# Patient Record
Sex: Female | Born: 1990 | Race: White | Hispanic: No | Marital: Married | Smoking: Never smoker
Health system: Southern US, Community
[De-identification: ages and names within clinical notes are randomized; demographics above are authoritative.]

## PROBLEM LIST (undated history)

## (undated) DIAGNOSIS — B009 Herpesviral infection, unspecified: Secondary | ICD-10-CM

## (undated) DIAGNOSIS — L709 Acne, unspecified: Secondary | ICD-10-CM

## (undated) HISTORY — DX: Herpesviral infection, unspecified: B00.9

## (undated) HISTORY — PX: NO PAST SURGERIES: SHX2092

## (undated) HISTORY — DX: Acne, unspecified: L70.9

---

## 2015-02-19 ENCOUNTER — Encounter: Payer: Self-pay | Admitting: Emergency Medicine

## 2015-02-19 ENCOUNTER — Ambulatory Visit
Admission: EM | Admit: 2015-02-19 | Discharge: 2015-02-19 | Disposition: A | Discharge status: Home / Self Care | Payer: 59 | Attending: Family Medicine | Admitting: Family Medicine

## 2015-02-19 ENCOUNTER — Ambulatory Visit: Payer: 59

## 2015-02-19 DIAGNOSIS — Y9351 Activity, roller skating (inline) and skateboarding: Secondary | ICD-10-CM | POA: Diagnosis not present

## 2015-02-19 DIAGNOSIS — W19XXXA Unspecified fall, initial encounter: Secondary | ICD-10-CM | POA: Insufficient documentation

## 2015-02-19 DIAGNOSIS — S40011A Contusion of right shoulder, initial encounter: Secondary | ICD-10-CM | POA: Diagnosis not present

## 2015-02-19 DIAGNOSIS — M25511 Pain in right shoulder: Secondary | ICD-10-CM | POA: Diagnosis present

## 2015-02-19 MED ORDER — IBUPROFEN 800 MG PO TABS: 800.0000 mg | ORAL_TABLET | Freq: Once | ORAL | Status: DC

## 2015-02-19 MED ORDER — MUPIROCIN CALCIUM 2 % EX CREA: 1.0000 "application " | TOPICAL_CREAM | Freq: Three times a day (TID) | CUTANEOUS | Status: DC

## 2015-02-19 NOTE — ED Provider Notes (Signed)
CSN: [id number]     Arrival date & time 02/19/15  1418 History   First MD Initiated Contact with Patient 02/19/15 1537     Chief Complaint  Patient presents with  . Abrasion  . Shoulder Pain    Right   (Consider location/radiation/quality/duration/timing/severity/associated sxs/prior Treatment) HPI23 yo F skate boarding last night - downhill-picked up speed and hit a sidewalk seam-  Right shoulder forward, hit first and  abraded; also right knee and right elbow.NO head contact. Witnessed. Has used arm since accident -soulder stiff and aching or awakening this AM. Last pain med was ibuprofen 800 mg po last night History reviewed. No pertinent past medical history. History reviewed. No pertinent past surgical history. History reviewed. No pertinent family history. History  Substance Use Topics  . Smoking status: Never Smoker   . Smokeless tobacco: Never Used  . Alcohol Use: Yes   OB History    No data available     Review of Systems Review of 10 systems negative for acute change except as referenced in HPI  Allergies  Review of patient's allergies indicates no known allergies.  Home Medications   Prior to Admission medications   Medication Sig Start Date End Date Taking? Authorizing Provider  mupirocin cream (BACTROBAN) 2 % Apply 1 application topically 3 (three) times daily. Please substitute with ointment 02/19/15   Rae Halsted, PA-C   BP 119/77 mmHg  Pulse 54  Temp(Src) 98.1 F (36.7 C) (Tympanic)  Resp 16  Ht 5' 2.5" (1.588 m)  Wt 123 lb (55.792 kg)  BMI 22.12 kg/m2  SpO2 97%  LMP 02/12/2015 (Exact Date) Physical Exam Constitutional -alert and oriented,well appearing and in mild distress Head-atraumatic Eyes- conjunctiva normal, EOMI ,conjugate gaze Ears -TM and canals neg Nose- no congestion or rhinorrhea Mouth/throat- mucous membranes moist ,no dental damage Neck- supple  CV- regular rate,brady - athlete- grossly normal heart sounds,  Resp-no distress, normal  respiratory effort,clear to auscultation bilaterally GI- soft,non-tender,no distention GU- not examined MSK- right knee with 2 abrasions, ankles without trauma, FROM; Right shoulder with posterior superficial abrasion ; ROM passive without restriction- active has anterior shoulder pain with int rotation, abduction and flexion. External rotation with assistance, painful active; Neuro- normal speech and language, no gross focal neurological deficit appreciated, no gait instability Skin-warm,dry  Psych-mood and affect grossly normal; speech and behavior grossly normal ED Course  Procedures (including critical care time) Labs Review Labs Reviewed - No data to display  Imaging Review Dg Shoulder Right  02/19/2015   CLINICAL DATA:  Skateboarding accident last night.  EXAM: RIGHT SHOULDER - 2+ VIEW  COMPARISON:  None.  FINDINGS: There is no evidence of fracture or dislocation. There is no evidence of arthropathy or other focal bone abnormality. Soft tissues are unremarkable.  IMPRESSION: Negative.   Electronically Signed   By: Marlan Palau M.D.   On: 02/19/2015 15:57   Medications  ibuprofen (ADVIL,MOTRIN) tablet 800 mg (not administered)  Administered by me , observed.   Wounds were cleansed and dressed with gauze wraps.  Shoulder supported by sling use prn.  ROM exercises reviewed- advance as tolerated. Ice paks/frozen peas discussed.    MDM   1. Contusion of right shoulder, initial encounter     Plan: Mother ,sister , patient in room for discharge 1. Test/x-ray results and diagnosis reviewed with patient 2. rx as per orders; risks, benefits, potential side effects reviewed with patient 3. Recommend supportive treatment with ice packs /sling to rest only 4. F/u  prn if symptoms worsen or don't improve- see Ortho in 2 weeks if without significiant improment  Rae Halsted, PA-C 02/19/15 1645

## 2015-02-19 NOTE — Discharge Instructions (Signed)
Sling for comfort- for rest- not continuous Ibuprofen 800 mg /tylenol  500 mg x 2 alternating Ice paks/frozen peas    Acromioclavicular Injuries The AC (acromioclavicular) joint is the joint in the shoulder where the collarbone (clavicle) meets the shoulder blade (scapula). The part of the shoulder blade connected to the collarbone is called the acromion. Common problems with and treatments for the [facility] joint are detailed below. ARTHRITIS Arthritis occurs when the joint has been injured and the smooth padding between the joints (cartilage) is lost. This is the wear and tear seen in most joints of the body if they have been overused. This causes the joint to produce pain and swelling which is worse with activity.  AC JOINT SEPARATION AC joint separation means that the ligaments connecting the acromion of the shoulder blade and collarbone have been damaged, and the two bones no longer line up. AC separations can be anywhere from mild to severe, and are "graded" depending upon which ligaments are torn and how badly they are torn.  Grade I Injury: the least damage is done, and the [facility] joint still lines up.  Grade II Injury: damage to the ligaments which reinforce the [facility] joint. In a Grade II injury, these ligaments are stretched but not entirely torn. When stressed, the [facility] joint becomes painful and unstable.  Grade III Injury: AC and secondary ligaments are completely torn, and the collarbone is no longer attached to the shoulder blade. This results in deformity; a prominence of the end of the clavicle. AC JOINT FRACTURE AC joint fracture means that there has been a break in the bones of the [facility] joint, usually the end of the clavicle. TREATMENT TREATMENT OF AC ARTHRITIS  There is currently no way to replace the cartilage damaged by arthritis. The best way to improve the condition is to decrease the activities which aggravate the problem. Application of ice to the joint helps decrease pain and  soreness (inflammation). The use of non-steroidal anti-inflammatory medication is helpful.  If less conservative measures do not work, then cortisone shots (injections) may be used. These are anti-inflammatories; they decrease the soreness in the joint and swelling.  If non-surgical measures fail, surgery may be recommended. The procedure is generally removal of a portion of the end of the clavicle. This is the part of the collarbone closest to your acromion which is stabilized with ligaments to the acromion of the shoulder blade. This surgery may be performed using a tube-like instrument with a light (arthroscope) for looking into a joint. It may also be performed as an open surgery through a small incision by the surgeon. Most patients will have good range of motion within 6 weeks and may return to all activity including sports by 8-12 weeks, barring complications. TREATMENT OF AN AC SEPARATION  The initial treatment is to decrease pain. This is best accomplished by immobilizing the arm in a sling and placing an ice pack to the shoulder for 20 to 30 minutes every 2 hours as needed. As the pain starts to subside, it is important to begin moving the fingers, wrist, elbow and eventually the shoulder in order to prevent a stiff or "frozen" shoulder. Instruction on when and how much to move the shoulder will be provided by your caregiver. The length of time needed to regain full motion and function depends on the amount or grade of the injury. Recovery from a Grade I AC separation usually takes 10 to 14 days, whereas a Grade III may  take 6 to 8 weeks.  Grade I and II separations usually do not require surgery. Even Grade III injuries usually allow return to full activity with few restrictions. Treatment is also based on the activity demands of the injured shoulder. For example, a high level quarterback with an injured throwing arm will receive more aggressive treatment than someone with a desk job who rarely  uses his/her arm for strenuous activities. In some cases, a painful lump may persist which could require a later surgery. Surgery can be very successful, but the benefits must be weighed against the potential risks. TREATMENT OF AN AC JOINT FRACTURE Fracture treatment depends on the type of fracture. Sometimes a splint or sling may be all that is required. Other times surgery may be required for repair. This is more frequently the case when the ligaments supporting the clavicle are completely torn. Your caregiver will help you with these decisions and together you can decide what will be the best treatment. HOME CARE INSTRUCTIONS   Apply ice to the injury for 15-20 minutes each hour while awake for 2 days. Put the ice in a plastic bag and place a towel between the bag of ice and skin.  If a sling has been applied, wear it constantly for as long as directed by your caregiver, even at night. The sling or splint can be removed for bathing or showering or as directed. Be sure to keep the shoulder in the same place as when the sling is on. Do not lift the arm.  If a figure-of-eight splint has been applied it should be tightened gently by another person every day. Tighten it enough to keep the shoulders held back. Allow enough room to place the index finger between the body and strap. Loosen the splint immediately if there is numbness or tingling in the hands.  Take over-the-counter or prescription medicines for pain, discomfort or fever as directed by your caregiver.  If you or your child has received a follow up appointment, it is very important to keep that appointment in order to avoid long term complications, chronic pain or disability. SEEK MEDICAL CARE IF:   The pain is not relieved with medications.  There is increased swelling or discoloration that continues to get worse rather than better.  You or your child has been unable to follow up as instructed.  There is progressive numbness and  tingling in the arm, forearm or hand. SEEK IMMEDIATE MEDICAL CARE IF:   The arm is numb, cold or pale.  There is increasing pain in the hand, forearm or fingers. MAKE SURE YOU:   Understand these instructions.  Will watch your condition.  Will get help right away if you are not doing well or get worse. Document Released: 06/07/2005 Document Revised: 11/20/2011 Document Reviewed: 11/30/2008 [facility] Patient Information 2015 [place], [place]. This information is not intended to replace advice given to you by your health care provider. Make sure you discuss any questions you have with your health care provider.

## 2015-02-19 NOTE — ED Notes (Signed)
Patient c/o right shoulder pain after falling while skateboarding yesterday.  Patient has abrasions on her right knee and right elbow.

## 2017-07-13 ENCOUNTER — Encounter: Payer: Self-pay | Admitting: Certified Nurse Midwife

## 2017-07-13 ENCOUNTER — Ambulatory Visit (INDEPENDENT_AMBULATORY_CARE_PROVIDER_SITE_OTHER): Payer: 59 | Admitting: Certified Nurse Midwife

## 2017-07-13 DIAGNOSIS — Z309 Encounter for contraceptive management, unspecified: Secondary | ICD-10-CM

## 2017-07-13 DIAGNOSIS — Z202 Contact with and (suspected) exposure to infections with a predominantly sexual mode of transmission: Secondary | ICD-10-CM | POA: Diagnosis not present

## 2017-07-13 DIAGNOSIS — Z01419 Encounter for gynecological examination (general) (routine) without abnormal findings: Secondary | ICD-10-CM

## 2017-07-13 DIAGNOSIS — B009 Herpesviral infection, unspecified: Secondary | ICD-10-CM | POA: Diagnosis not present

## 2017-07-13 LAB — POCT URINE PREGNANCY: Preg Test, Ur: NEGATIVE

## 2017-07-13 MED ORDER — MISOPROSTOL 200 MCG PO TABS: 400.0000 ug | ORAL_TABLET | Freq: Once | ORAL | 0 refills | Status: DC

## 2017-07-13 MED ORDER — VALACYCLOVIR HCL 1 G PO TABS
1000.0000 mg | ORAL_TABLET | Freq: Every day | ORAL | 6 refills | Status: AC
Start: 2017-07-13 — End: 2017-07-18

## 2017-07-13 NOTE — Patient Instructions (Signed)

## 2017-07-13 NOTE — Progress Notes (Signed)
GYNECOLOGY ANNUAL PREVENTATIVE CARE ENCOUNTER NOTE  Subjective:   Sandra Arellano is a 26 y.o. G0P0000 female here for a routine annual gynecologic exam.  Current complaints: none.   Denies abnormal vaginal bleeding, discharge, pelvic pain, problems with intercourse or other gynecologic concerns.    Gynecologic History No LMP recorded. Contraception: OCP (estrogen/progesterone) would like to change to IUD/Nexplanon Last Pap: Never had .  Last mammogram: N/A Obstetric History OB History  Gravida Para Term Preterm AB Living  0 0 0 0 0 0  SAB TAB Ectopic Multiple Live Births  0 0 0 0 0        Past Medical History:  Diagnosis Date  . Acne   . HSV-2 (herpes simplex virus 2) infection     Past Surgical History:  Procedure Laterality Date  . NO PAST SURGERIES      No current outpatient prescriptions on file prior to visit.   No current facility-administered medications on file prior to visit.     No Known Allergies  Social History   Social History  . Marital status: Single    Spouse name: N/A  . Number of children: N/A  . Years of education: N/A   Occupational History  . Not on file.   Social History Main Topics  . Smoking status: Never Smoker  . Smokeless tobacco: Never Used  . Alcohol use Yes     Comment: occas  . Drug use: No  . Sexual activity: Not Currently   Other Topics Concern  . Not on file   Social History Narrative  . No narrative on file    Family History  Problem Relation Age of Onset  . Diabetes Maternal Grandmother   . Diabetes Maternal Grandfather   . Breast cancer Paternal Grandmother   . Diabetes Paternal Grandmother   . Diabetes Paternal Grandfather   . Ovarian cancer Neg Hx   . Colon cancer Neg Hx     The following portions of the patient's history were reviewed and updated as appropriate: allergies, current medications, past family history, past medical history, past social history, past surgical history and problem  list.  Review of Systems Constitutional: negative Eyes: negative Ears, nose, mouth, throat, and face: negative Respiratory: negative Cardiovascular: negative Gastrointestinal: negative Genitourinary:negative Integument/breast: negative Hematologic/lymphatic: negative Musculoskeletal:negative Neurological: negative Behavioral/Psych: negative Endocrine: negative Allergic/Immunologic: negative   Objective:  There were no vitals taken for this visit. CONSTITUTIONAL: Well-developed, well-nourished female in no acute distress.  HENT:  Normocephalic, atraumatic, External right and left ear normal. Oropharynx is clear and moist EYES: Conjunctivae and EOM are normal. Pupils are equal, round, and reactive to light. No scleral icterus.  NECK: Normal range of motion, supple, no masses.  Normal thyroid.  SKIN: Skin is warm and dry. No rash noted. Not diaphoretic. No erythema. No pallor. Multiple tattoos  NEUROLOGIC: Alert and oriented to person, place, and time. Normal reflexes, muscle tone coordination. No cranial nerve deficit noted. PSYCHIATRIC: Normal mood and affect. Normal behavior. Normal judgment and thought content. CARDIOVASCULAR: Normal heart rate noted, regular rhythm RESPIRATORY: Clear to auscultation bilaterally. Effort and breath sounds normal, no problems with respiration noted. BREASTS: Symmetric in size. No masses, skin changes, nipple drainage, or lymphadenopathy. ABDOMEN: Soft, normal bowel sounds, no distention noted.  No tenderness, rebound or guarding.  PELVIC: Normal appearing external genitalia; normal appearing vaginal mucosa and cervix.  No abnormal discharge noted.  Pap smear obtained.  Normal uterine size, no other palpable masses, no uterine or adnexal tenderness. MUSCULOSKELETAL: Normal  range of motion. No tenderness.  No cyanosis, clubbing, or edema.  2+ distal pulses.   Assessment and Plan:  1. Well woman exam with routine gynecological exam - Pap IG, CT/NG w/  reflex HPV when ASC-U  2. Encounter for contraceptive management, unspecified type - POCT urine pregnancy  3. STD exposure - Pap IG, CT/NG w/ reflex HPV when ASC-U  4. HSV-2 (herpes simplex virus 2) infection   Will follow up results of pap smear and manage accordingly. Contraceptive counseling- pt would like to change birth control and is leaning towards IUD. Discussed procedure, risks & benefits. She verbalizes understanding and agrees to plan. Order placed for misoprostol the night before placement. Safe sex emphasized. Routine preventative health maintenance measures emphasized. Please refer to After Visit Summary for other counseling recommendations. PT to follow up PRN for placement of IUD.    Doreene BurkeAnnie Tyller Bowlby, CNM

## 2017-07-17 LAB — PAP IG, CT-NG, RFX HPV ASCU
Chlamydia, Nuc. Acid Amp: NEGATIVE
Gonococcus by Nucleic Acid Amp: NEGATIVE
PAP Smear Comment: 0

## 2017-07-18 ENCOUNTER — Encounter: Payer: Self-pay | Admitting: Certified Nurse Midwife

## 2017-07-20 ENCOUNTER — Other Ambulatory Visit: Payer: Self-pay

## 2017-07-20 ENCOUNTER — Encounter: Payer: Self-pay | Admitting: Certified Nurse Midwife

## 2017-07-20 ENCOUNTER — Ambulatory Visit (INDEPENDENT_AMBULATORY_CARE_PROVIDER_SITE_OTHER): Payer: 59 | Admitting: Certified Nurse Midwife

## 2017-07-20 VITALS — BP 113/84 | HR 67 | Wt 129.2 lb

## 2017-07-20 DIAGNOSIS — Z975 Presence of (intrauterine) contraceptive device: Secondary | ICD-10-CM | POA: Diagnosis not present

## 2017-07-20 NOTE — Progress Notes (Signed)
    GYNECOLOGY OFFICE PROCEDURE NOTE  Sandra Arellano is a 26 y.o. G0P0000 here for Mirena IUD insertion. No GYN concerns.  Last pap smear was on 07/13/17 and was LSIL. LMP 07/14/17 has not had intercourse since her period.   IUD Insertion Procedure Note Patient identified, informed consent performed, consent signed.   Discussed risks of irregular bleeding, cramping, infection, malpositioning or misplacement of the IUD outside the uterus which may require further procedure such as laparoscopy. Time out was performed.  Urine pregnancy test negative.  Speculum placed in the vagina.  Cervix visualized.  Cleaned with Betadine x 2.  Grasped anteriorly with a single tooth tenaculum.  Uterus sounded to 7 cm.  Mirena IUD placed per manufacturer's recommendations.  Strings trimmed to 3 cm. Tenaculum was removed, good hemostasis noted.  Patient tolerated procedure well.   Patient was given post-procedure instructions.  She was advised to have backup contraception for one week.  Patient was also asked to check IUD strings periodically and follow up in 4 weeks for IUD check.

## 2017-07-20 NOTE — Patient Instructions (Signed)

## 2017-08-08 ENCOUNTER — Telehealth: Payer: Self-pay

## 2017-08-08 NOTE — Telephone Encounter (Signed)
-----   Message from Doreene BurkeAnnie Thompson, [place] sent at 08/06/2017 11:54 AM EST -----  Eunice Blaseebbie can you send Sandra Arellano a letter with her pap smear results. I sent her a my chart message but she has not checked it.   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 The recommended follow up is to repeat the pap smear in 1 year. Low-grade squamous intraepithelial lesion (LSIL)-LSIL means that the cervical cells show changes that are mildly abnormal. LSIL usually is caused by an HPV infection that often goes away on its own. So please have the pap smear repeated in 1 yr. So we can make sure that it has resolved.   Thanks,  Pattricia BossAnnie

## 2017-08-08 NOTE — Telephone Encounter (Signed)
Left pt message to check her my chart message that was sent to get her pap results. If any questions please contact office at [phone].

## 2017-08-17 ENCOUNTER — Encounter: Payer: 59 | Admitting: Certified Nurse Midwife

## 2017-08-24 ENCOUNTER — Ambulatory Visit (INDEPENDENT_AMBULATORY_CARE_PROVIDER_SITE_OTHER): Payer: 59 | Admitting: Certified Nurse Midwife

## 2017-08-24 ENCOUNTER — Encounter: Payer: Self-pay | Admitting: Certified Nurse Midwife

## 2017-08-24 VITALS — BP 108/71 | HR 80 | Ht 62.5 in | Wt 132.2 lb

## 2017-08-24 DIAGNOSIS — N898 Other specified noninflammatory disorders of vagina: Secondary | ICD-10-CM | POA: Diagnosis not present

## 2017-08-24 MED ORDER — VALACYCLOVIR HCL 1 G PO TABS: 1000.0000 mg | ORAL_TABLET | Freq: Two times a day (BID) | ORAL | 11 refills | Status: DC

## 2017-08-24 MED ORDER — METRONIDAZOLE 500 MG PO TABS
500.0000 mg | ORAL_TABLET | Freq: Two times a day (BID) | ORAL | 0 refills | Status: AC
Start: 2017-08-24 — End: 2017-08-31

## 2017-08-24 NOTE — Progress Notes (Signed)
GYNECOLOGY OFFICE PROGRESS NOTE  History:  26 y.o. G0P0000 here today for today for IUD string check; mirena IUD was placed  07/20/2017. No complaints about the IUD, no concerning side effects. She is complaining of vaginal discharge with an odor.   The following portions of the patient's history were reviewed and updated as appropriate: allergies, current medications, past family history, past medical history, past social history, past surgical history and problem list.  Review of Systems:  Pertinent items are noted in HPI.   Objective:  Physical Exam Blood pressure 108/71, pulse 80, height 5' 2.5" (1.588 m), weight 132 lb 3.2 oz (60 kg), last menstrual period 08/10/2017. CONSTITUTIONAL: Well-developed, well-nourished female in no acute distress.  HENT:  Normocephalic, atraumatic. External right and left ear normal. Oropharynx is clear and moist EYES: Conjunctivae and EOM are normal.  NECK: Normal range of motion, supple, no masses CARDIOVASCULAR: Normal heart rate noted RESPIRATORY: Effort and breath sounds normal, no problems with respiration noted ABDOMEN: Soft, no distention noted.   PELVIC: Normal appearing external genitalia; normal appearing vaginal mucosa and cervix.  IUD strings visualized, about 3cm in length outside cervix.   Assessment & Plan:  Normal IUD check. Odor noted with exam . Flagyl order. Will follow up with results.  Patient to keep IUD in place for five years; can come in for removal if she desires pregnancy within the next five years. Routine preventative health maintenance measures emphasized.   Doreene BurkeAnnie Jaquilla Woodroof, CNM

## 2017-08-24 NOTE — Patient Instructions (Signed)

## 2017-08-28 LAB — NUSWAB VAGINITIS PLUS (VG+)
ATOPOBIUM VAGINAE: HIGH {score} — AB
BVAB 2: HIGH Score — AB
CANDIDA GLABRATA, NAA: NEGATIVE
Candida albicans, NAA: NEGATIVE
Chlamydia trachomatis, NAA: NEGATIVE
Megasphaera 1: HIGH Score — AB
NEISSERIA GONORRHOEAE, NAA: NEGATIVE
Trich vag by NAA: NEGATIVE

## 2017-08-29 ENCOUNTER — Encounter: Payer: Self-pay | Admitting: Certified Nurse Midwife

## 2019-02-06 ENCOUNTER — Other Ambulatory Visit: Payer: Self-pay | Admitting: Certified Nurse Midwife

## 2019-02-19 ENCOUNTER — Telehealth: Payer: Self-pay | Admitting: Certified Nurse Midwife

## 2019-02-19 ENCOUNTER — Other Ambulatory Visit: Payer: Self-pay

## 2019-02-19 MED ORDER — VALACYCLOVIR HCL 1 G PO TABS: 1000.0000 mg | ORAL_TABLET | Freq: Two times a day (BID) | ORAL | 0 refills | Status: DC

## 2019-02-19 NOTE — Telephone Encounter (Signed)
The patient called and stated that she needs a prescription refill of valACYclovir (VALTREX) 1000 MG tablet. The patient stated that her pharmacy was unable to fill the prescription when she called. Please advise.

## 2019-04-07 ENCOUNTER — Telehealth: Payer: Self-pay | Admitting: Family Medicine

## 2019-04-07 NOTE — Telephone Encounter (Signed)
Patient wants an IUD Removal and get on birth control pills. Best time to call Mon-Thurs 1-2pm (lunch hour) or Fridays anytime.

## 2019-04-07 NOTE — Telephone Encounter (Signed)
TC to patient, left message with number to call.Jenetta Downer, RN

## 2019-04-09 NOTE — Telephone Encounter (Signed)
TC with patient. Requests IUD removal and would like pills for BCM.  Patient needs Friday appt. Scheduled 04/11/19 Aileen Fass, RN

## 2019-04-11 ENCOUNTER — Other Ambulatory Visit: Payer: Self-pay

## 2019-04-11 ENCOUNTER — Ambulatory Visit (LOCAL_COMMUNITY_HEALTH_CENTER): Payer: Self-pay | Admitting: Nurse Practitioner

## 2019-04-11 VITALS — BP 110/74 | Ht 62.0 in | Wt 134.2 lb

## 2019-04-11 DIAGNOSIS — Z3009 Encounter for other general counseling and advice on contraception: Secondary | ICD-10-CM

## 2019-04-11 DIAGNOSIS — Z30432 Encounter for removal of intrauterine contraceptive device: Secondary | ICD-10-CM

## 2019-04-11 NOTE — Progress Notes (Signed)
Here today for IUD removal and to start Ocp's.  Hal Morales, RN

## 2019-04-14 ENCOUNTER — Other Ambulatory Visit: Payer: Self-pay | Admitting: Family Medicine

## 2019-04-14 MED ORDER — NORETHIN ACE-ETH ESTRAD-FE 1.5-30 MG-MCG PO TABS: 1.0000 | ORAL_TABLET | Freq: Every day | ORAL | 2 refills | Status: DC

## 2019-04-14 NOTE — Telephone Encounter (Signed)
Patient was seen Friday and was prescibed a prescription. Presciption was never sent over to [facility] in [place] [address]

## 2019-04-14 NOTE — Telephone Encounter (Signed)
Consult with Wilburn Mylar FNP after reviewing last weeks visit. Ok to call in Micronor x 3 months.  [facility] 1 po qd x 3 refills called in per Wilburn Mylar FNP visit on 04/11/19.  TC to patient and left on VM that Rx had been called in and that we were not sure why Rx didn't go through to [facility]. Aileen Fass, RN

## 2019-04-14 NOTE — Progress Notes (Signed)
   IUD Removal  Patient identified, informed consent performed, consent signed.  Patient was in the dorsal lithotomy position, normal external genitalia was noted.  A speculum was placed in the patient's vagina, normal discharge was noted, no lesions. The cervix was visualized, no lesions, no abnormal discharge.  The strings of the IUD were grasped and pulled using ring forceps. The IUD was removed in its entirety. Patient tolerated the procedure well.    Patient will use OCP's - (Microgestin) for contraception/Client does not plan for pregnancy soon and she was told to avoid teratogens, take PNV and folic acid.  Routine preventative health maintenance measures emphasized. Advised to RTC in 3 months for yearly physical exam and to receive RF's on OCP's for continuation of BCM. Client verbalizes understanding and is in agreement with plan of care

## 2019-05-27 ENCOUNTER — Telehealth: Payer: Self-pay | Admitting: Family Medicine

## 2019-05-27 DIAGNOSIS — Z3009 Encounter for other general counseling and advice on contraception: Secondary | ICD-10-CM

## 2019-05-27 MED ORDER — NORETHIN ACE-ETH ESTRAD-FE 1.5-30 MG-MCG PO TABS: 1.0000 | ORAL_TABLET | Freq: Every day | ORAL | 4 refills | Status: DC

## 2019-05-27 MED ORDER — NORETHIN ACE-ETH ESTRAD-FE 1.5-30 MG-MCG PO TABS: 1.0000 | ORAL_TABLET | Freq: Every day | ORAL | 2 refills | Status: DC

## 2019-05-27 NOTE — Telephone Encounter (Signed)
Returned patient phone call. Patient is requesting refill for Ocp's she obtained at 04/11/2019 FP appt. Patient states she is not experiencing any problems with these pills, is having normal periods and has not missed or taken any pills late. Is aware to use condoms with all sex and to call for RP appt once our facility resumes FP yearly physicals. Note routed to Dr. Vertell Novak for refill authorization. Hal Morales, RN

## 2019-05-27 NOTE — Telephone Encounter (Signed)
Wants more BC PILLS

## 2019-06-03 ENCOUNTER — Telehealth: Payer: Self-pay

## 2019-06-03 ENCOUNTER — Telehealth: Payer: Self-pay | Admitting: Family Medicine

## 2019-06-03 NOTE — Telephone Encounter (Signed)
Needs BC pill prescription send to her Pharmcacy [facility]

## 2019-06-03 NOTE — Telephone Encounter (Signed)
TC from patient.  Informed patient that [facility] sent in a renewal for a years worth of BC on 05/27/19. Instructed to check with pharmacy. Aileen Fass, RN

## 2019-06-03 NOTE — Telephone Encounter (Signed)
TC from patient.  [facility] for [facility]. She would like to schedule appt to see provider and get Rx for [facility].  Appt scheduled for 06/06/19 Aileen Fass, RN

## 2019-06-06 ENCOUNTER — Other Ambulatory Visit: Payer: Self-pay

## 2019-06-06 ENCOUNTER — Ambulatory Visit (LOCAL_COMMUNITY_HEALTH_CENTER): Payer: Self-pay | Admitting: Advanced Practice Midwife

## 2019-06-06 VITALS — Ht 62.0 in | Wt 133.0 lb

## 2019-06-06 DIAGNOSIS — Z3009 Encounter for other general counseling and advice on contraception: Secondary | ICD-10-CM

## 2019-06-06 DIAGNOSIS — B009 Herpesviral infection, unspecified: Secondary | ICD-10-CM

## 2019-06-06 MED ORDER — NORETHIN ACE-ETH ESTRAD-FE 1-20 MG-MCG PO TABS: 1.0000 | ORAL_TABLET | Freq: Every day | ORAL | 2 refills | Status: DC

## 2019-06-06 NOTE — Progress Notes (Signed)
Pt here for OCP supply. There has been some confusion and problems with last Rx.

## 2019-06-06 NOTE — Progress Notes (Signed)
   [facility] problem visit  [facility]  Subjective:  Sandra Arellano is a 28 y.o.nullip nonsmoker being seen today for ran out of ocp's and is on last pack  Chief Complaint  Patient presents with  . Contraception    needs OCP supply, some confusion about Rx    HPI  Pt states she was here 04/11/19 and given 3 packs Microgestin Fe 1/20 and lost a pack because is on last pack now and needs more.  Happy and compliant with ocp's. Does the patient have a current or past history of drug use? No   No components found for: HCV]   Health Maintenance Due  Topic Date Due  . HIV Screening  07/25/2008  . TETANUS/TDAP  07/25/2012  . INFLUENZA VACCINE  04/12/2019    ROS  The following portions of the patient's history were reviewed and updated as appropriate: allergies, current medications, past family history, past medical history, past social history, past surgical history and problem list. Problem list updated.   See flowsheet for other program required questions.  Objective:   Vitals:   06/06/19 0845  Weight: 133 lb (60.3 kg)  Height: 5\' 2"  (1.575 m)    Physical Exam  n/a  Assessment and Plan:  Sandra Arellano is a 28 y.o. female presenting to the [facility] Department for a Women's Health problem visit  1. Herpes On Acyclovir  2. Family planning Please dispense Microgestin Fe 1/20 #3 today.  Will need physical at next appt    No follow-ups on file.  No future appointments.  Herbie Saxon, CNM

## 2019-06-12 ENCOUNTER — Other Ambulatory Visit: Payer: Self-pay

## 2019-06-12 DIAGNOSIS — Z20822 Contact with and (suspected) exposure to covid-19: Secondary | ICD-10-CM

## 2019-06-13 LAB — NOVEL CORONAVIRUS, NAA: SARS-CoV-2, NAA: NOT DETECTED

## 2019-07-21 ENCOUNTER — Ambulatory Visit: Payer: Self-pay | Admitting: Obstetrics and Gynecology

## 2019-08-05 ENCOUNTER — Other Ambulatory Visit: Payer: Self-pay

## 2019-08-05 ENCOUNTER — Ambulatory Visit (INDEPENDENT_AMBULATORY_CARE_PROVIDER_SITE_OTHER): Payer: No Typology Code available for payment source | Admitting: Advanced Practice Midwife

## 2019-08-05 ENCOUNTER — Encounter: Payer: Self-pay | Admitting: Advanced Practice Midwife

## 2019-08-05 ENCOUNTER — Ambulatory Visit: Payer: Self-pay | Admitting: Advanced Practice Midwife

## 2019-08-05 ENCOUNTER — Other Ambulatory Visit (HOSPITAL_COMMUNITY)
Admission: RE | Admit: 2019-08-05 | Discharge: 2019-08-05 | Disposition: A | Discharge status: Home / Self Care | Payer: No Typology Code available for payment source | Source: Ambulatory Visit | Attending: Obstetrics and Gynecology | Admitting: Obstetrics and Gynecology

## 2019-08-05 VITALS — BP 120/80 | Temp 98.0°F | Ht 62.0 in | Wt 133.0 lb

## 2019-08-05 DIAGNOSIS — Z01419 Encounter for gynecological examination (general) (routine) without abnormal findings: Secondary | ICD-10-CM

## 2019-08-05 DIAGNOSIS — Z113 Encounter for screening for infections with a predominantly sexual mode of transmission: Secondary | ICD-10-CM

## 2019-08-05 DIAGNOSIS — Z124 Encounter for screening for malignant neoplasm of cervix: Secondary | ICD-10-CM | POA: Diagnosis present

## 2019-08-05 DIAGNOSIS — N898 Other specified noninflammatory disorders of vagina: Secondary | ICD-10-CM | POA: Insufficient documentation

## 2019-08-05 DIAGNOSIS — Z0000 Encounter for general adult medical examination without abnormal findings: Secondary | ICD-10-CM

## 2019-08-05 NOTE — Patient Instructions (Signed)
Health Maintenance, Female Adopting a healthy lifestyle and getting preventive care are important in promoting health and wellness. Ask your health care provider about:  The right schedule for you to have regular tests and exams.  Things you can do on your own to prevent diseases and keep yourself healthy. What should I know about diet, weight, and exercise? Eat a healthy diet   Eat a diet that includes plenty of vegetables, fruits, low-fat dairy products, and lean protein.  Do not eat a lot of foods that are high in solid fats, added sugars, or sodium. Maintain a healthy weight Body mass index (BMI) is used to identify weight problems. It estimates body fat based on height and weight. Your health care provider can help determine your BMI and help you achieve or maintain a healthy weight. Get regular exercise Get regular exercise. This is one of the most important things you can do for your health. Most adults should:  Exercise for at least 150 minutes each week. The exercise should increase your heart rate and make you sweat (moderate-intensity exercise).  Do strengthening exercises at least twice a week. This is in addition to the moderate-intensity exercise.  Spend less time sitting. Even light physical activity can be beneficial. Watch cholesterol and blood lipids Have your blood tested for lipids and cholesterol at 26 years of age, then have this test every 5 years. Have your cholesterol levels checked more often if:  Your lipid or cholesterol levels are high.  You are older than 26 years of age.  You are at high risk for heart disease. What should I know about cancer screening? Depending on your health history and family history, you may need to have cancer screening at various ages. This may include screening for:  Breast cancer.  Cervical cancer.  Colorectal cancer.  Skin cancer.  Lung cancer. What should I know about heart disease, diabetes, and high blood  pressure? Blood pressure and heart disease  High blood pressure causes heart disease and increases the risk of stroke. This is more likely to develop in people who have high blood pressure readings, are of African descent, or are overweight.  Have your blood pressure checked: ? Every 3-5 years if you are 18-39 years of age. ? Every year if you are 40 years old or older. Diabetes Have regular diabetes screenings. This checks your fasting blood sugar level. Have the screening done:  Once every three years after age 40 if you are at a normal weight and have a low risk for diabetes.  More often and at a younger age if you are overweight or have a high risk for diabetes. What should I know about preventing infection? Hepatitis B If you have a higher risk for hepatitis B, you should be screened for this virus. Talk with your health care provider to find out if you are at risk for hepatitis B infection. Hepatitis C Testing is recommended for:  Everyone born from 1945 through 1965.  Anyone with known risk factors for hepatitis C. Sexually transmitted infections (STIs)  Get screened for STIs, including gonorrhea and chlamydia, if: ? You are sexually active and are younger than 26 years of age. ? You are older than 26 years of age and your health care provider tells you that you are at risk for this type of infection. ? Your sexual activity has changed since you were last screened, and you are at increased risk for chlamydia or gonorrhea. Ask your health care provider if   you are at risk.  Ask your health care provider about whether you are at high risk for HIV. Your health care provider may recommend a prescription medicine to help prevent HIV infection. If you choose to take medicine to prevent HIV, you should first get tested for HIV. You should then be tested every 3 months for as long as you are taking the medicine. Pregnancy  If you are about to stop having your period (premenopausal) and  you may become pregnant, seek counseling before you get pregnant.  Take 400 to 800 micrograms (mcg) of folic acid every day if you become pregnant.  Ask for birth control (contraception) if you want to prevent pregnancy. Osteoporosis and menopause Osteoporosis is a disease in which the bones lose minerals and strength with aging. This can result in bone fractures. If you are 65 years old or older, or if you are at risk for osteoporosis and fractures, ask your health care provider if you should:  Be screened for bone loss.  Take a calcium or vitamin D supplement to lower your risk of fractures.  Be given hormone replacement therapy (HRT) to treat symptoms of menopause. Follow these instructions at home: Lifestyle  Do not use any products that contain nicotine or tobacco, such as cigarettes, e-cigarettes, and chewing tobacco. If you need help quitting, ask your health care provider.  Do not use street drugs.  Do not share needles.  Ask your health care provider for help if you need support or information about quitting drugs. Alcohol use  Do not drink alcohol if: ? Your health care provider tells you not to drink. ? You are pregnant, may be pregnant, or are planning to become pregnant.  If you drink alcohol: ? Limit how much you use to 0-1 drink a day. ? Limit intake if you are breastfeeding.  Be aware of how much alcohol is in your drink. In the [place] one drink equals one 12 oz bottle of beer (355 mL), one 5 oz glass of wine (148 mL), or one 1 oz glass of hard liquor (44 mL). General instructions  Schedule regular health, dental, and eye exams.  Stay current with your vaccines.  Tell your health care provider if: ? You often feel depressed. ? You have ever been abused or do not feel safe at home. Summary  Adopting a healthy lifestyle and getting preventive care are important in promoting health and wellness.  Follow your health care provider's instructions about healthy  diet, exercising, and getting tested or screened for diseases.  Follow your health care provider's instructions on monitoring your cholesterol and blood pressure. This information is not intended to replace advice given to you by your health care provider. Make sure you discuss any questions you have with your health care provider. Document Released: 03/13/2011 Document Revised: 08/21/2018 Document Reviewed: 08/21/2018 Elsevier Patient Education  2020 [employer] Inc[employer]  

## 2019-08-06 LAB — HEPATITIS B SURFACE ANTIBODY,QUALITATIVE: Hep B Surface Ab, Qual: NONREACTIVE

## 2019-08-06 LAB — HIV ANTIBODY (ROUTINE TESTING W REFLEX): HIV Screen 4th Generation wRfx: NONREACTIVE

## 2019-08-06 LAB — CBC WITH DIFFERENTIAL/PLATELET
Basophils Absolute: 0.1 10*3/uL (ref 0.0–0.2)
Basos: 1 %
EOS (ABSOLUTE): 0.1 10*3/uL (ref 0.0–0.4)
Eos: 1 %
Hematocrit: 41.8 % (ref 34.0–46.6)
Hemoglobin: 14 g/dL (ref 11.1–15.9)
Immature Grans (Abs): 0 10*3/uL (ref 0.0–0.1)
Immature Granulocytes: 0 %
Lymphocytes Absolute: 2.3 10*3/uL (ref 0.7–3.1)
Lymphs: 25 %
MCH: 31.2 pg (ref 26.6–33.0)
MCHC: 33.5 g/dL (ref 31.5–35.7)
MCV: 93 fL (ref 79–97)
Monocytes Absolute: 0.6 10*3/uL (ref 0.1–0.9)
Monocytes: 7 %
Neutrophils Absolute: 5.9 10*3/uL (ref 1.4–7.0)
Neutrophils: 66 %
Platelets: 280 10*3/uL (ref 150–450)
RBC: 4.49 x10E6/uL (ref 3.77–5.28)
RDW: 11.1 % — ABNORMAL LOW (ref 11.7–15.4)
WBC: 9 10*3/uL (ref 3.4–10.8)

## 2019-08-06 LAB — THYROID PANEL WITH TSH
Free Thyroxine Index: 2 (ref 1.2–4.9)
T3 Uptake Ratio: 29 % (ref 24–39)
T4, Total: 6.8 ug/dL (ref 4.5–12.0)
TSH: 0.818 u[IU]/mL (ref 0.450–4.500)

## 2019-08-06 LAB — RPR QUALITATIVE: RPR Ser Ql: NONREACTIVE

## 2019-08-07 NOTE — Progress Notes (Signed)
Gynecology Annual Exam   PCP: Patient, No Pcp Per  Chief Complaint:  Chief Complaint  Patient presents with  . Gynecologic Exam  . Vaginitis    History of Present Illness: Patient is a 28 y.o. G0P0000 presents for annual exam. The patient has complaint today of vaginal irritation and itching for the past month. She has noticed milky discharge. She has been bruising easily for the past 6 months. She denies history of bleeding disorder. We discussed adding supplement of bioflavinoid/vitamin c for blood vessel health.    She had her IUD removed in August and is trying to conceive.  We discussed the results of her last PAP smear. We will repeat PAP today.   LMP: Patient's last menstrual period was 08/05/2019. Average Interval: regular, 28 days Duration of flow: 4 days Heavy Menses: no Clots: no Intermenstrual Bleeding: no Postcoital Bleeding: no Dysmenorrhea: no  The patient is sexually active. She currently uses none for contraception. She denies dyspareunia.  The patient does perform self breast exams.  There is no notable family history of breast or ovarian cancer in her family.  The patient wears seatbelts: yes.   The patient has regular exercise: she does strength training. She admits healthy lifestyle diet, hydration and sleep.    The patient denies current symptoms of depression.    Review of Systems: Review of Systems  Constitutional: Negative.   HENT: Negative.   Eyes: Negative.   Respiratory: Negative.   Cardiovascular: Negative.   Gastrointestinal: Negative.   Genitourinary: Positive for frequency.  Musculoskeletal: Negative.   Skin: Negative.   Neurological: Negative.   Endo/Heme/Allergies: Positive for environmental allergies. Bruises/bleeds easily.  Psychiatric/Behavioral: Negative.     Past Medical History:  Past Medical History:  Diagnosis Date  . Acne   . HSV-2 (herpes simplex virus 2) infection     Past Surgical History:  Past Surgical  History:  Procedure Laterality Date  . NO PAST SURGERIES      Gynecologic History:  Patient's last menstrual period was 08/05/2019. Contraception: none Last Pap: 2018 Results were: low-grade squamous intraepithelial neoplasia (LGSIL - encompassing HPV,mild dysplasia,CIN I)   Obstetric History: G0P0000  Family History:  Family History  Problem Relation Age of Onset  . Diabetes Maternal Grandmother   . Heart disease Maternal Grandmother   . Diabetes Maternal Grandfather   . COPD Maternal Grandfather   . Breast cancer Paternal Grandmother   . Diabetes Paternal Grandmother   . Diabetes Paternal Grandfather   . Kidney disease Paternal Grandfather   . Hypertension Father   . Ovarian cancer Neg Hx   . Colon cancer Neg Hx     Social History:  Social History   Socioeconomic History  . Marital status: Single    Spouse name: Not on file  . Number of children: Not on file  . Years of education: Not on file  . Highest education level: Not on file  Occupational History  . Not on file  Social Needs  . Financial resource strain: Not on file  . Food insecurity    Worry: Not on file    Inability: Not on file  . Transportation needs    Medical: Not on file    Non-medical: Not on file  Tobacco Use  . Smoking status: Never Smoker  . Smokeless tobacco: Never Used  Substance and Sexual Activity  . Alcohol use: Yes    Comment: occas  . Drug use: No  . Sexual activity: Yes  Comment: mirena inserted 07/30/2017  Lifestyle  . Physical activity    Days per week: Not on file    Minutes per session: Not on file  . Stress: Not on file  Relationships  . Social Musicianconnections    Talks on phone: Not on file    Gets together: Not on file    Attends religious service: Not on file    Active member of club or organization: Not on file    Attends meetings of clubs or organizations: Not on file    Relationship status: Not on file  . Intimate partner violence    Fear of current or ex  partner: Not on file    Emotionally abused: Not on file    Physically abused: Not on file    Forced sexual activity: Not on file  Other Topics Concern  . Not on file  Social History Narrative  . Not on file    Allergies:  No Known Allergies  Medications: Prior to Admission medications   Medication Sig Start Date End Date Taking? Authorizing Provider  valACYclovir (VALTREX) 1000 MG tablet Take 1 tablet (1,000 mg total) by mouth 2 (two) times daily. Patient will need appointment before more refills authorized. 02/19/19  Yes Doreene Burkehompson, Annie, CNM  Cranberry 250 MG TABS Take 1 tablet by mouth. Pt unsure of MG    [provider]  Evening Primrose Oil 500 MG CAPS Take by mouth.    [provider]    Physical Exam Vitals: Blood pressure 120/80, temperature 98 F (36.7 C), height 5\' 2"  (1.575 m), weight 133 lb (60.3 kg), last menstrual period 08/05/2019.  General: NAD HEENT: normocephalic, anicteric Thyroid: no enlargement, no palpable nodules Pulmonary: No increased work of breathing, CTAB Cardiovascular: RRR, distal pulses 2+ Breast: Breast symmetrical, no tenderness, no palpable nodules or masses, no skin or nipple retraction present, no nipple discharge.  No axillary or supraclavicular lymphadenopathy. Abdomen: NABS, soft, non-tender, non-distended.  Umbilicus without lesions.  No hepatomegaly, splenomegaly or masses palpable. No evidence of hernia  Genitourinary:  External: Normal external female genitalia.  Normal urethral meatus, normal Bartholin's and Skene's glands.    Vagina: Normal vaginal mucosa, no evidence of prolapse.    Cervix: Grossly normal in appearance, no bleeding, no CMT  Uterus: Non-enlarged, mobile, normal contour.    Adnexa: ovaries non-enlarged, no adnexal masses  Rectal: deferred  Lymphatic: no evidence of inguinal lymphadenopathy Extremities: no edema, erythema, or tenderness Neurologic: Grossly intact Psychiatric: mood appropriate, affect  full    Assessment: 28 y.o. G0P0000 routine annual exam  Plan: Problem List Items Addressed This Visit    None    Visit Diagnoses    Well woman exam with routine gynecological exam    -  Primary   Relevant Orders   Cytology - PAP   Cervicovaginal ancillary only   CBC w Diff (LC) (Completed)   Thyroid Panel W TSH (LC) (Completed)   HIV antibody (LC) (Completed)   Hepatitis B surface antibody (LC) (Completed)   RPR Qual (LC) (Completed)   Screen for sexually transmitted diseases       Relevant Orders   Cytology - PAP   HIV antibody (LC) (Completed)   Hepatitis B surface antibody (LC) (Completed)   RPR Qual (LC) (Completed)   Routine cervical smear       Relevant Orders   Cytology - PAP   Vaginal irritation       Relevant Orders   Cervicovaginal ancillary only   Blood tests  for routine general physical examination       Relevant Orders   CBC w Diff (LC) (Completed)   Thyroid Panel W TSH (LC) (Completed)      1) STI screening  was offered and accepted  2)  ASCCP guidelines and rationale discussed.  Patient opts for every 3 years screening interval and as needed sooner  3) Contraception - the patient is currently using  none.  She is attempting to conceive in the near future. Preconception health/PNV use discussed  4) Routine healthcare maintenance including cholesterol, diabetes screening discussed Ordered today  5) Return in about 1 year (around 08/04/2020) for annual established gyn.   Rod Can, [facility] 08/07/2019, 2:17 PM

## 2019-08-08 LAB — CYTOLOGY - PAP
Chlamydia: NEGATIVE
Comment: NEGATIVE
Comment: NEGATIVE
Comment: NORMAL
Diagnosis: NEGATIVE
Neisseria Gonorrhea: NEGATIVE
Trichomonas: NEGATIVE

## 2019-08-08 LAB — CERVICOVAGINAL ANCILLARY ONLY
Bacterial Vaginitis (gardnerella): NEGATIVE
Candida Glabrata: NEGATIVE
Candida Vaginitis: NEGATIVE
Comment: NEGATIVE
Comment: NEGATIVE
Comment: NEGATIVE

## 2019-09-12 NOTE — L&D Delivery Note (Signed)
Delivery Note At 4:13 AM a viable female was delivered via Vaginal, Spontaneous (Presentation: Right Occiput Anterior).  APGAR: 8, 9; weight pending.   Placenta status: Spontaneous, Intact.  Cord: 3 vessels with the following complications: None.  Cord pH: N/A  Anesthesia: Epidural Episiotomy: None Lacerations: 2nd degree;Perineal;Labial Suture Repair: 3.0 vicryl Est. Blood Loss (mL): 620  Mom to postpartum.  Baby to Couplet care / Skin to Skin.  Called to see patient.  Mom pushed to deliver a viable female infant.  The head followed by shoulders, which delivered without difficulty, and the rest of the body.  No nuchal cord noted.  Baby to mom's chest.  Cord clamped and cut after > 1 min delay.  Cord blood obtained.  Placenta delivered spontaneously, intact, with a 3-vessel cord.  Second degree perineal laceration repaired with 3-0 Vicryl in standard fashion.  All counts correct.  Hemostasis obtained with IV pitocin and fundal massage. QBL 620 mL.     Thomasene Mohair, MD 08/02/2020, 5:02 AM

## 2019-11-07 ENCOUNTER — Encounter: Payer: Self-pay | Admitting: Physician Assistant

## 2019-11-07 NOTE — Progress Notes (Signed)
Received refill request for patient for Valacyclovir.  Per chart review, patient was last here for a RP visit  in 2017.  Had appt 03/2019 for IUD removal and started on OCs at that visit.  Seen again 05/2019, and received more OCs.  Completed form for Valacyclovir 1g  #31 1 po daly for 1 year and faxed to [facility].  Fax confirmation received.  Form and fax confirmation sent for scanning.

## 2019-12-23 ENCOUNTER — Other Ambulatory Visit: Payer: Self-pay

## 2019-12-23 ENCOUNTER — Ambulatory Visit (INDEPENDENT_AMBULATORY_CARE_PROVIDER_SITE_OTHER): Payer: 59 | Admitting: Advanced Practice Midwife

## 2019-12-23 ENCOUNTER — Encounter: Payer: Self-pay | Admitting: Advanced Practice Midwife

## 2019-12-23 ENCOUNTER — Other Ambulatory Visit (HOSPITAL_COMMUNITY)
Admission: RE | Admit: 2019-12-23 | Discharge: 2019-12-23 | Disposition: A | Discharge status: Home / Self Care | Payer: 59 | Source: Ambulatory Visit | Attending: Advanced Practice Midwife | Admitting: Advanced Practice Midwife

## 2019-12-23 VITALS — BP 118/80 | Wt 138.0 lb

## 2019-12-23 DIAGNOSIS — Z113 Encounter for screening for infections with a predominantly sexual mode of transmission: Secondary | ICD-10-CM

## 2019-12-23 DIAGNOSIS — Z3401 Encounter for supervision of normal first pregnancy, first trimester: Secondary | ICD-10-CM | POA: Diagnosis present

## 2019-12-23 DIAGNOSIS — N912 Amenorrhea, unspecified: Secondary | ICD-10-CM | POA: Diagnosis not present

## 2019-12-23 LAB — OB RESULTS CONSOLE VARICELLA ZOSTER ANTIBODY, IGG: Varicella: IMMUNE

## 2019-12-23 LAB — POCT URINE PREGNANCY: Preg Test, Ur: POSITIVE — AB

## 2019-12-23 LAB — OB RESULTS CONSOLE GC/CHLAMYDIA: Gonorrhea: NEGATIVE

## 2019-12-23 NOTE — Patient Instructions (Signed)
Breastfeeding  Choosing to breastfeed is one of the best decisions you can make for yourself and your baby. A change in hormones during pregnancy causes your breasts to make breast milk in your milk-producing glands. Hormones prevent breast milk from being released before your baby is born. They also prompt milk flow after birth. Once breastfeeding has begun, thoughts of your baby, as well as his or her sucking or crying, can stimulate the release of milk from your milk-producing glands. Benefits of breastfeeding Research shows that breastfeeding offers many health benefits for infants and mothers. It also offers a cost-free and convenient way to feed your baby. For your baby  Your first milk (colostrum) helps your baby's digestive system to function better.  Special cells in your milk (antibodies) help your baby to fight off infections.  Breastfed babies are less likely to develop asthma, allergies, obesity, or type 2 diabetes. They are also at lower risk for sudden infant death syndrome (SIDS).  Nutrients in breast milk are better able to meet your baby's needs compared to infant formula.  Breast milk improves your baby's brain development. For you  Breastfeeding helps to create a very special bond between you and your baby.  Breastfeeding is convenient. Breast milk costs nothing and is always available at the correct temperature.  Breastfeeding helps to burn calories. It helps you to lose the weight that you gained during pregnancy.  Breastfeeding makes your uterus return faster to its size before pregnancy. It also slows bleeding (lochia) after you give birth.  Breastfeeding helps to lower your risk of developing type 2 diabetes, osteoporosis, rheumatoid arthritis, cardiovascular disease, and breast, ovarian, uterine, and endometrial cancer later in life. Breastfeeding basics Starting breastfeeding  Find a comfortable place to sit or lie down, with your neck and back  well-supported.  Place a pillow or a rolled-up blanket under your baby to bring him or her to the level of your breast (if you are seated). Nursing pillows are specially designed to help support your arms and your baby while you breastfeed.  Make sure that your baby's tummy (abdomen) is facing your abdomen.  Gently massage your breast. With your fingertips, massage from the outer edges of your breast inward toward the nipple. This encourages milk flow. If your milk flows slowly, you may need to continue this action during the feeding.  Support your breast with 4 fingers underneath and your thumb above your nipple (make the letter "C" with your hand). Make sure your fingers are well away from your nipple and your baby's mouth.  Stroke your baby's lips gently with your finger or nipple.  When your baby's mouth is open wide enough, quickly bring your baby to your breast, placing your entire nipple and as much of the areola as possible into your baby's mouth. The areola is the colored area around your nipple. ? More areola should be visible above your baby's upper lip than below the lower lip. ? Your baby's lips should be opened and extended outward (flanged) to ensure an adequate, comfortable latch. ? Your baby's tongue should be between his or her lower gum and your breast.  Make sure that your baby's mouth is correctly positioned around your nipple (latched). Your baby's lips should create a seal on your breast and be turned out (everted).  It is common for your baby to suck about 2-3 minutes in order to start the flow of breast milk. Latching Teaching your baby how to latch onto your breast properly is  very important. An improper latch can cause nipple pain, decreased milk supply, and poor weight gain in your baby. Also, if your baby is not latched onto your nipple properly, he or she may swallow some air during feeding. This can make your baby fussy. Burping your baby when you switch breasts  during the feeding can help to get rid of the air. However, teaching your baby to latch on properly is still the best way to prevent fussiness from swallowing air while breastfeeding. Signs that your baby has successfully latched onto your nipple  Silent tugging or silent sucking, without causing you pain. Infant's lips should be extended outward (flanged).  Swallowing heard between every 3-4 sucks once your milk has started to flow (after your let-down milk reflex occurs).  Muscle movement above and in front of his or her ears while sucking. Signs that your baby has not successfully latched onto your nipple  Sucking sounds or smacking sounds from your baby while breastfeeding.  Nipple pain. If you think your baby has not latched on correctly, slip your finger into the corner of your baby's mouth to break the suction and place it between your baby's gums. Attempt to start breastfeeding again. Signs of successful breastfeeding Signs from your baby  Your baby will gradually decrease the number of sucks or will completely stop sucking.  Your baby will fall asleep.  Your baby's body will relax.  Your baby will retain a small amount of milk in his or her mouth.  Your baby will let go of your breast by himself or herself. Signs from you  Breasts that have increased in firmness, weight, and size 1-3 hours after feeding.  Breasts that are softer immediately after breastfeeding.  Increased milk volume, as well as a change in milk consistency and color by the fifth day of breastfeeding.  Nipples that are not sore, cracked, or bleeding. Signs that your baby is getting enough milk  Wetting at least 1-2 diapers during the first 24 hours after birth.  Wetting at least 5-6 diapers every 24 hours for the first week after birth. The urine should be clear or pale yellow by the age of 5 days.  Wetting 6-8 diapers every 24 hours as your baby continues to grow and develop.  At least 3 stools in  a 24-hour period by the age of 5 days. The stool should be soft and yellow.  At least 3 stools in a 24-hour period by the age of 7 days. The stool should be seedy and yellow.  No loss of weight greater than 10% of birth weight during the first 3 days of life.  Average weight gain of 4-7 oz (113-198 g) per week after the age of 4 days.  Consistent daily weight gain by the age of 5 days, without weight loss after the age of 2 weeks. After a feeding, your baby may spit up a small amount of milk. This is normal. Breastfeeding frequency and duration Frequent feeding will help you make more milk and can prevent sore nipples and extremely full breasts (breast engorgement). Breastfeed when you feel the need to reduce the fullness of your breasts or when your baby shows signs of hunger. This is called "breastfeeding on demand." Signs that your baby is hungry include:  Increased alertness, activity, or restlessness.  Movement of the head from side to side.  Opening of the mouth when the corner of the mouth or cheek is stroked (rooting).  Increased sucking sounds, smacking lips, cooing,   sighing, or squeaking.  Hand-to-mouth movements and sucking on fingers or hands.  Fussing or crying. Avoid introducing a pacifier to your baby in the first 4-6 weeks after your baby is born. After this time, you may choose to use a pacifier. Research has shown that pacifier use during the first year of a baby's life decreases the risk of sudden infant death syndrome (SIDS). Allow your baby to feed on each breast as long as he or she wants. When your baby unlatches or falls asleep while feeding from the first breast, offer the second breast. Because newborns are often sleepy in the first few weeks of life, you may need to awaken your baby to get him or her to feed. Breastfeeding times will vary from baby to baby. However, the following rules can serve as a guide to help you make sure that your baby is properly  fed:  Newborns (babies 41 weeks of age or younger) may breastfeed every 1-3 hours.  Newborns should not go without breastfeeding for longer than 3 hours during the day or 5 hours during the night.  You should breastfeed your baby a minimum of 8 times in a 24-hour period. Breast milk pumping     Pumping and storing breast milk allows you to make sure that your baby is exclusively fed your breast milk, even at times when you are unable to breastfeed. This is especially important if you go back to work while you are still breastfeeding, or if you are not able to be present during feedings. Your lactation consultant can help you find a method of pumping that works best for you and give you guidelines about how long it is safe to store breast milk. Caring for your breasts while you breastfeed Nipples can become dry, cracked, and sore while breastfeeding. The following recommendations can help keep your breasts moisturized and healthy:  Avoid using soap on your nipples.  Wear a supportive bra designed especially for nursing. Avoid wearing underwire-style bras or extremely tight bras (sports bras).  Air-dry your nipples for 3-4 minutes after each feeding.  Use only cotton bra pads to absorb leaked breast milk. Leaking of breast milk between feedings is normal.  Use lanolin on your nipples after breastfeeding. Lanolin helps to maintain your skin's normal moisture barrier. Pure lanolin is not harmful (not toxic) to your baby. You may also hand express a few drops of breast milk and gently massage that milk into your nipples and allow the milk to air-dry. In the first few weeks after giving birth, some women experience breast engorgement. Engorgement can make your breasts feel heavy, warm, and tender to the touch. Engorgement peaks within 3-5 days after you give birth. The following recommendations can help to ease engorgement:  Completely empty your breasts while breastfeeding or pumping. You may  want to start by applying warm, moist heat (in the shower or with warm, water-soaked hand towels) just before feeding or pumping. This increases circulation and helps the milk flow. If your baby does not completely empty your breasts while breastfeeding, pump any extra milk after he or she is finished.  Apply ice packs to your breasts immediately after breastfeeding or pumping, unless this is too uncomfortable for you. To do this: ? Put ice in a plastic bag. ? Place a towel between your skin and the bag. ? Leave the ice on for 20 minutes, 2-3 times a day.  Make sure that your baby is latched on and positioned properly while breastfeeding. If  engorgement persists after 48 hours of following these recommendations, contact your health care provider or a Advertising copywriter. Overall health care recommendations while breastfeeding  Eat 3 healthy meals and 3 snacks every day. Well-nourished mothers who are breastfeeding need an additional 450-500 calories a day. You can meet this requirement by increasing the amount of a balanced diet that you eat.  Drink enough water to keep your urine pale yellow or clear.  Rest often, relax, and continue to take your prenatal vitamins to prevent fatigue, stress, and low vitamin and mineral levels in your body (nutrient deficiencies).  Do not use any products that contain nicotine or tobacco, such as cigarettes and e-cigarettes. Your baby may be harmed by chemicals from cigarettes that pass into breast milk and exposure to secondhand smoke. If you need help quitting, ask your health care provider.  Avoid alcohol.  Do not use illegal drugs or marijuana.  Talk with your health care provider before taking any medicines. These include over-the-counter and prescription medicines as well as vitamins and herbal supplements. Some medicines that may be harmful to your baby can pass through breast milk.  It is possible to become pregnant while breastfeeding. If birth  control is desired, ask your health care provider about options that will be safe while breastfeeding your baby. Where to find more information: [employer]: www.llli.org Contact a health care provider if:  You feel like you want to stop breastfeeding or have become frustrated with breastfeeding.  Your nipples are cracked or bleeding.  Your breasts are red, tender, or warm.  You have: ? Painful breasts or nipples. ? A swollen area on either breast. ? A fever or chills. ? Nausea or vomiting. ? Drainage other than breast milk from your nipples.  Your breasts do not become full before feedings by the fifth day after you give birth.  You feel sad and depressed.  Your baby is: ? Too sleepy to eat well. ? Having trouble sleeping. ? More than 104 week old and wetting fewer than 6 diapers in a 24-hour period. ? Not gaining weight by 14 days of age.  Your baby has fewer than 3 stools in a 24-hour period.  Your baby's skin or the white parts of his or her eyes become yellow. Get help right away if:  Your baby is overly tired (lethargic) and does not want to wake up and feed.  Your baby develops an unexplained fever. Summary  Breastfeeding offers many health benefits for infant and mothers.  Try to breastfeed your infant when he or she shows early signs of hunger.  Gently tickle or stroke your baby's lips with your finger or nipple to allow the baby to open his or her mouth. Bring the baby to your breast. Make sure that much of the areola is in your baby's mouth. Offer one side and burp the baby before you offer the other side.  Talk with your health care provider or lactation consultant if you have questions or you face problems as you breastfeed. This information is not intended to replace advice given to you by your health care provider. Make sure you discuss any questions you have with your health care provider. Document Revised: 11/22/2017 Document Reviewed:  09/29/2016 Elsevier Patient Education  2020 [employer]. Exercise During Pregnancy Exercise is an important part of being healthy for people of all ages. Exercise improves the function of your heart and lungs and helps you maintain strength, flexibility, and a healthy body weight.  Exercise also boosts energy levels and elevates mood. Most women should exercise regularly during pregnancy. In rare cases, women with certain medical conditions or complications may be asked to limit or avoid exercise during pregnancy. How does this affect me? Along with maintaining general strength and flexibility, exercising during pregnancy can help:  Keep strength in muscles that are used during labor and childbirth.  Decrease low back pain.  Reduce symptoms of depression.  Control weight gain during pregnancy.  Reduce the risk of needing insulin if you develop diabetes during pregnancy.  Decrease the risk of cesarean delivery.  Speed up your recovery after giving birth. How does this affect my baby? Exercise can help you have a healthy pregnancy. Exercise does not cause premature birth. It will not cause your baby to weigh less at birth. What exercises can I do? Many exercises are safe for you to do during pregnancy. Do a variety of exercises that safely increase your heart and breathing rates and help you build and maintain muscle strength. Do exercises exactly as told by your health care provider. You may do these exercises:  Walking or hiking.  Swimming.  Water aerobics.  Riding a stationary bike.  Strength training.  Modified yoga or Pilates. Tell your instructor that you are pregnant. Avoid overstretching, and avoid lying on your back for long periods of time.  Running or jogging. Only choose this type of exercise if you: ? Ran or jogged regularly before your pregnancy. ? Can run or jog and still talk in complete sentences. What exercises should I avoid? Depending on your level of  fitness and whether you exercised regularly before your pregnancy, you may be told to limit high-intensity exercise. You can tell that you are exercising at a high intensity if you are breathing much harder and faster and cannot hold a conversation while exercising. You must avoid:  Contact sports.  Activities that put you at risk for falling on or being hit in the belly, such as downhill skiing, water skiing, surfing, rock climbing, cycling, gymnastics, and horseback riding.  Scuba diving.  Skydiving.  Yoga or Pilates in a room that is heated to high temperatures.  Jogging or running, unless you ran or jogged regularly before your pregnancy. While jogging or running, you should always be able to talk in full sentences. Do not run or jog so fast that you are unable to have a conversation.  Do not exercise at more than 6,000 feet above sea level (high elevation) if you are not used to exercising at high elevation. How do I exercise in a safe way?   Avoid overheating. Do not exercise in very high temperatures.  Wear loose-fitting, breathable clothes.  Avoid dehydration. Drink enough water before, during, and after exercise to keep your urine pale yellow.  Avoid overstretching. Because of hormone changes during pregnancy, it is easy to overstretch muscles, tendons, and ligaments during pregnancy.  Start slowly and ask your health care provider to recommend the types of exercise that are safe for you.  Do not exercise to lose weight. Follow these instructions at home:  Exercise on most days or all days of the week. Try to exercise for 30 minutes a day, 5 days a week, unless your health care provider tells you not to.  If you actively exercised before your pregnancy and you are healthy, your health care provider may tell you to continue to do moderate to high-intensity exercise.  If you are just starting to exercise or did not  exercise much before your pregnancy, your health care  provider may tell you to do low to moderate-intensity exercise. Questions to ask your health care provider  Is exercise safe for me?  What are signs that I should stop exercising?  Does my health condition mean that I should not exercise during pregnancy?  When should I avoid exercising during pregnancy? Stop exercising and contact a health care provider if: You have any unusual symptoms, such as:  Mild contractions of the uterus or cramps in the abdomen.  Dizziness that does not go away when you rest. Stop exercising and get help right away if: You have any unusual symptoms, such as:  Sudden, severe pain in your low back or your belly.  Mild contractions of the uterus or cramps in the abdomen that do not improve with rest and drinking fluids.  Chest pain.  Bleeding or fluid leaking from your vagina.  Shortness of breath. These symptoms may represent a serious problem that is an emergency. Do not wait to see if the symptoms will go away. Get medical help right away. Call your local emergency services (911 in the [place] Do not drive yourself to the hospital. Summary  Most women should exercise regularly throughout pregnancy. In rare cases, women with certain medical conditions or complications may be asked to limit or avoid exercise during pregnancy.  Do not exercise to lose weight during pregnancy.  Your health care provider will tell you what level of physical activity is right for you.  Stop exercising and contact a health care provider if you have mild contractions of the uterus or cramps in the abdomen. Get help right away if these contractions or cramps do not improve with rest and drinking fluids.  Stop exercising and get help right away if you have sudden, severe pain in your low back or belly, chest pain, shortness of breath, or bleeding or leaking of fluid from your vagina. This information is not intended to replace advice given to you by your health care provider.  Make sure you discuss any questions you have with your health care provider. Document Revised: 12/19/2018 Document Reviewed: 10/02/2018 Elsevier Patient Education  2020 [employer] for Pregnant Women While you are pregnant, your body requires additional nutrition to help support your growing baby. You also have a higher need for some vitamins and minerals, such as folic acid, calcium, iron, and vitamin D. Eating a healthy, well-balanced diet is very important for your health and your baby's health. Your need for extra calories varies for the three 44-month segments of your pregnancy (trimesters). For most women, it is recommended to consume:  150 extra calories a day during the first trimester.  300 extra calories a day during the second trimester.  300 extra calories a day during the third trimester. What are tips for following this plan?   Do not try to lose weight or go on a diet during pregnancy.  Limit your overall intake of foods that have "empty calories." These are foods that have little nutritional value, such as sweets, desserts, candies, and sugar-sweetened beverages.  Eat a variety of foods (especially fruits and vegetables) to get a full range of vitamins and minerals.  Take a prenatal vitamin to help meet your additional vitamin and mineral needs during pregnancy, specifically for folic acid, iron, calcium, and vitamin D.  Remember to stay active. Ask your health care provider what types of exercise and activities are safe for you.  Practice good food safety  and cleanliness. Wash your hands before you eat and after you prepare raw meat. Wash all fruits and vegetables well before peeling or eating. Taking these actions can help to prevent food-borne illnesses that can be very dangerous to your baby, such as listeriosis. Ask your health care provider for more information about listeriosis. What does 150 extra calories look like? Healthy options that provide 150  extra calories each day could be any of the following:  6-8 oz (170-230 g) of plain low-fat yogurt with  cup of berries.  1 apple with 2 teaspoons (11 g) of peanut butter.  Cut-up vegetables with  cup (60 g) of hummus.  8 oz (230 mL) or 1 cup of low-fat chocolate milk.  1 stick of string cheese with 1 medium orange.  1 peanut butter and jelly sandwich that is made with one slice of whole-wheat bread and 1 tsp (5 g) of peanut butter. For 300 extra calories, you could eat two of those healthy options each day. What is a healthy amount of weight to gain? The right amount of weight gain for you is based on your BMI before you became pregnant. If your BMI:  Was less than 18 (underweight), you should gain 28-40 lb (13-18 kg).  Was 18-24.9 (normal), you should gain 25-35 lb (11-16 kg).  Was 25-29.9 (overweight), you should gain 15-25 lb (7-11 kg).  Was 30 or greater (obese), you should gain 11-20 lb (5-9 kg). What if I am having twins or multiples? Generally, if you are carrying twins or multiples:  You may need to eat 300-600 extra calories a day.  The recommended range for total weight gain is 25-54 lb (11-25 kg), depending on your BMI before pregnancy.  Talk with your health care provider to find out about nutritional needs, weight gain, and exercise that is right for you. What foods can I eat?  Fruits All fruits. Eat a variety of colors and types of fruit. Remember to wash your fruits well before peeling or eating. Vegetables All vegetables. Eat a variety of colors and types of vegetables. Remember to wash your vegetables well before peeling or eating. Grains All grains. Choose whole grains, such as whole-wheat bread, oatmeal, or brown rice. Meats and other protein foods Lean meats, including chicken, [place], fish, and lean cuts of beef, veal, or pork. If you eat fish or seafood, choose options that are higher in omega-3 fatty acids and lower in mercury, such as salmon,  herring, mussels, trout, sardines, pollock, shrimp, crab, and lobster. Tofu. Tempeh. Beans. Eggs. Peanut butter and other nut butters. Make sure that all meats, poultry, and eggs are cooked to food-safe temperatures or "well-done." Two or more servings of fish are recommended each week in order to get the most benefits from omega-3 fatty acids that are found in seafood. Choose fish that are lower in mercury. You can find more information online:  [employer] Dairy Pasteurized milk and milk alternatives (such as almond milk). Pasteurized yogurt and pasteurized cheese. Cottage cheese. Sour cream. Beverages Water. Juices that contain 100% fruit juice or vegetable juice. Caffeine-free teas and decaffeinated coffee. Drinks that contain caffeine are okay to drink, but it is better to avoid caffeine. Keep your total caffeine intake to less than 200 mg each day (which is 12 oz or 355 mL of coffee, tea, or soda) or the limit as told by your health care provider. Fats and oils Fats and oils are okay to include in moderation. Sweets and desserts Sweets and desserts  are okay to include in moderation. Seasoning and other foods All pasteurized condiments. The items listed above may not be a complete list of foods and beverages you can eat. Contact a dietitian for more information. What foods are not recommended? Fruits Unpasteurized fruit juices. Vegetables Raw (unpasteurized) vegetable juices. Meats and other protein foods Lunch meats, bologna, hot dogs, or other deli meats. (If you must eat those meats, reheat them until they are steaming hot.) Refrigerated pat, meat spreads from a meat counter, smoked seafood that is found in the refrigerated section of a store. Raw or undercooked meats, poultry, and eggs. Raw fish, such as sushi or sashimi. Fish that have high mercury content, such as tilefish, shark, swordfish, and king mackerel. To learn more about mercury in fish, talk with your health care provider  or look for online resources, such as:  PumpkinSearch.com.ee Dairy Raw (unpasteurized) milk and any foods that have raw milk in them. Soft cheeses, such as feta, queso blanco, queso fresco, Brie, Camembert cheeses, blue-veined cheeses, and Panela cheese (unless it is made with pasteurized milk, which must be stated on the label). Beverages Alcohol. Sugar-sweetened beverages, such as sodas, teas, or energy drinks. Seasoning and other foods Homemade fermented foods and drinks, such as pickles, sauerkraut, or kombucha drinks. (Store-bought pasteurized versions of these are okay.) Salads that are made in a store or deli, such as ham salad, chicken salad, egg salad, tuna salad, and seafood salad. The items listed above may not be a complete list of foods and beverages you should avoid. Contact a dietitian for more information. Where to find more information To calculate the number of calories you need based on your height, weight, and activity level, you can use an online calculator such as:  PackageNews.is To calculate how much weight you should gain during pregnancy, you can use an online pregnancy weight gain calculator such as:  http://jones-berg.com/ Summary  While you are pregnant, your body requires additional nutrition to help support your growing baby.  Eat a variety of foods, especially fruits and vegetables to get a full range of vitamins and minerals.  Practice good food safety and cleanliness. Wash your hands before you eat and after you prepare raw meat. Wash all fruits and vegetables well before peeling or eating. Taking these actions can help to prevent food-borne illnesses, such as listeriosis, that can be very dangerous to your baby.  Do not eat raw meat or fish. Do not eat fish that have high mercury content, such as tilefish, shark, swordfish, and king mackerel. Do not eat unpasteurized (raw) dairy.  Take a prenatal vitamin to help  meet your additional vitamin and mineral needs during pregnancy, specifically for folic acid, iron, calcium, and vitamin D. This information is not intended to replace advice given to you by your health care provider. Make sure you discuss any questions you have with your health care provider. Document Revised: 01/16/2019 Document Reviewed: 05/25/2017 Elsevier Patient Education  2020 [employer]. Prenatal Care Prenatal care is health care during pregnancy. It helps you and your unborn baby (fetus) stay as healthy as possible. Prenatal care may be provided by a midwife, a family practice health care provider, or a childbirth and pregnancy specialist (obstetrician). How does this affect me? During pregnancy, you will be closely monitored for any new conditions that might develop. To lower your risk of pregnancy complications, you and your health care provider will talk about any underlying conditions you have. How does this affect my baby? Early and  consistent prenatal care increases the chance that your baby will be healthy during pregnancy. Prenatal care lowers the risk that your baby will be:  Born early (prematurely).  Smaller than expected at birth (small for gestational age). What can I expect at the first prenatal care visit? Your first prenatal care visit will likely be the longest. You should schedule your first prenatal care visit as soon as you know that you are pregnant. Your first visit is a good time to talk about any questions or concerns you have about pregnancy. At your visit, you and your health care provider will talk about:  Your medical history, including: ? Any past pregnancies. ? Your family's medical history. ? The baby's father's medical history. ? Any long-term (chronic) health conditions you have and how you manage them. ? Any surgeries or procedures you have had. ? Any current over-the-counter or prescription medicines, herbs, or supplements you are taking.  Other  factors that could pose a risk to your baby, including:  Your home setting and your stress levels, including: ? Exposure to abuse or violence. ? Household financial strain. ? Mental health conditions you have.  Your daily health habits, including diet and exercise. Your health care provider will also:  Measure your weight, height, and blood pressure.  Do a physical exam, including a pelvic and breast exam.  Perform blood tests and urine tests to check for: ? Urinary tract infection. ? Sexually transmitted infections (STIs). ? Low iron levels in your blood (anemia). ? Blood type and certain proteins on red blood cells (Rh antibodies). ? Infections and immunity to viruses, such as hepatitis B and rubella. ? HIV (human immunodeficiency virus).  Do an ultrasound to confirm your baby's growth and development and to help predict your estimated due date (EDD). This ultrasound is done with a probe that is inserted into the vagina (transvaginal ultrasound).  Discuss your options for genetic screening.  Give you information about how to keep yourself and your baby healthy, including: ? Nutrition and taking vitamins. ? Physical activity. ? How to manage pregnancy symptoms such as nausea and vomiting (morning sickness). ? Infections and substances that may be harmful to your baby and how to avoid them. ? Food safety. ? Dental care. ? Working. ? Travel. ? Warning signs to watch for and when to call your health care provider. How often will I have prenatal care visits? After your first prenatal care visit, you will have regular visits throughout your pregnancy. The visit schedule is often as follows:  Up to week 28 of pregnancy: once every 4 weeks.  28-36 weeks: once every 2 weeks.  After 36 weeks: every week until delivery. Some women may have visits more or less often depending on any underlying health conditions and the health of the baby. Keep all follow-up and prenatal care visits  as told by your health care provider. This is important. What happens during routine prenatal care visits? Your health care provider will:  Measure your weight and blood pressure.  Check for fetal heart sounds.  Measure the height of your uterus in your abdomen (fundal height). This may be measured starting around week 20 of pregnancy.  Check the position of your baby inside your uterus.  Ask questions about your diet, sleeping patterns, and whether you can feel the baby move.  Review warning signs to watch for and signs of labor.  Ask about any pregnancy symptoms you are having and how you are dealing with them. Symptoms may  include: ? Headaches. ? Nausea and vomiting. ? Vaginal discharge. ? Swelling. ? Fatigue. ? Constipation. ? Any discomfort, including back or pelvic pain. Make a list of questions to ask your health care provider at your routine visits. What tests might I have during prenatal care visits? You may have blood, urine, and imaging tests throughout your pregnancy, such as:  Urine tests to check for glucose, protein, or signs of infection.  Glucose tests to check for a form of diabetes that can develop during pregnancy (gestational diabetes mellitus). This is usually done around week 24 of pregnancy.  An ultrasound to check your baby's growth and development and to check for birth defects. This is usually done around week 20 of pregnancy.  A test to check for group B strep (GBS) infection. This is usually done around week 36 of pregnancy.  Genetic testing. This may include blood or imaging tests, such as an ultrasound. Some genetic tests are done during the first trimester and some are done during the second trimester. What else can I expect during prenatal care visits? Your health care provider may recommend getting certain vaccines during pregnancy. These may include:  A yearly flu shot (annual influenza vaccine). This is especially important if you will be  pregnant during flu season.  Tdap (tetanus, diphtheria, pertussis) vaccine. Getting this vaccine during pregnancy can protect your baby from whooping cough (pertussis) after birth. This vaccine may be recommended between weeks 27 and 36 of pregnancy. Later in your pregnancy, your health care provider may give you information about:  Childbirth and breastfeeding classes.  Choosing a health care provider for your baby.  Umbilical cord banking.  Breastfeeding.  Birth control after your baby is born.  The hospital labor and delivery unit and how to tour it.  Registering at the hospital before you go into labor. Where to find more information  Office on Women's Health: TravelLesson.ca  American Pregnancy Association: americanpregnancy.org  March of Dimes: marchofdimes.org Summary  Prenatal care helps you and your baby stay as healthy as possible during pregnancy.  Your first prenatal care visit will most likely be the longest.  You will have visits and tests throughout your pregnancy to monitor your health and your baby's health.  Bring a list of questions to your visits to ask your health care provider.  Make sure to keep all follow-up and prenatal care visits with your health care provider. This information is not intended to replace advice given to you by your health care provider. Make sure you discuss any questions you have with your health care provider. Document Revised: 12/18/2018 Document Reviewed: 08/27/2017 Elsevier Patient Education  2020 [employer].

## 2019-12-24 DIAGNOSIS — Z3403 Encounter for supervision of normal first pregnancy, third trimester: Secondary | ICD-10-CM | POA: Insufficient documentation

## 2019-12-24 DIAGNOSIS — Z3401 Encounter for supervision of normal first pregnancy, first trimester: Secondary | ICD-10-CM | POA: Insufficient documentation

## 2019-12-24 LAB — RPR+RH+ABO+RUB AB+AB SCR+CB...
Antibody Screen: NEGATIVE
HIV Screen 4th Generation wRfx: NONREACTIVE
Hematocrit: 39.8 % (ref 34.0–46.6)
Hemoglobin: 13.5 g/dL (ref 11.1–15.9)
Hepatitis B Surface Ag: NEGATIVE
MCH: 31 pg (ref 26.6–33.0)
MCHC: 33.9 g/dL (ref 31.5–35.7)
MCV: 91 fL (ref 79–97)
Platelets: 223 10*3/uL (ref 150–450)
RBC: 4.36 x10E6/uL (ref 3.77–5.28)
RDW: 11.4 % — ABNORMAL LOW (ref 11.7–15.4)
RPR Ser Ql: NONREACTIVE
Rh Factor: POSITIVE
Rubella Antibodies, IGG: 2.3 index (ref >0.99)
Varicella zoster IgG: 3684 index (ref >165)
WBC: 9.3 10*3/uL (ref 3.4–10.8)

## 2019-12-24 NOTE — Progress Notes (Signed)
New Obstetric Patient H&P  Date of Service: 12/23/2019  Chief Complaint: "Desires prenatal care"   History of Present Illness: Patient is a 29 y.o. G1P0000 Not Hispanic or [place] female, presents with amenorrhea and positive home pregnancy test. Patient's last menstrual period was 10/22/2019 (exact date). and based on her  LMP, her EDD is Estimated Date of Delivery: 07/28/20 and her EGA is [redacted]w[redacted]d. Cycles are 5. days, regular, and occur approximately every : 28 days. Her last pap smear was 5 months ago and was no abnormalities.    She had a urine pregnancy test which was positive 3 or 4 week(s)  ago. Her last menstrual period was normal and lasted for  5 day(s). Since her LMP she claims she has experienced breast tenderness, fatigue, nausea. She denies vaginal bleeding. Her past medical history is contributory for HSV. This is her first pregnancy.  Since her LMP, she admits to the use of tobacco products  no She claims she has gained   5 pounds since the start of her pregnancy.  There are cats in the home in the home  no  She admits close contact with children on a regular basis  no  She has had chicken pox in the past yes She has had Tuberculosis exposures, symptoms, or previously tested positive for TB   no Current or past history of domestic violence. no  Genetic Screening/Teratology Counseling: (Includes patient, baby's father, or anyone in either family with:)   65. Patient's age >/= 75 at [facility]  no 2. Thalassemia ([place], [place], [place], or Asian background): MCV<80  no 3. Neural tube defect (meningomyelocele, spina bifida, anencephaly)  no 4. Congenital heart defect  no  5. Down syndrome  no 6. Tay-Sachs (Jewish, [place])  no 7. Canavan's Disease  no 8. Sickle cell disease or trait (African)  no  9. Hemophilia or other blood disorders  no  10. Muscular dystrophy  no  11. Cystic fibrosis  no  12. Huntington's Chorea  no  13. Mental retardation/autism  no 14. Other  inherited genetic or chromosomal disorder  no 15. Maternal metabolic disorder (DM, PKU, etc)  no 16. Patient or FOB with a child with a birth defect not listed above no  16a. Patient or FOB with a birth defect themselves no 17. Recurrent pregnancy loss, or stillbirth  no  18. Any medications since LMP other than prenatal vitamins (include vitamins, supplements, OTC meds, drugs, alcohol)  no 19. Any other genetic/environmental exposure to discuss  no  Infection History:   1. Lives with someone with TB or TB exposed  no  2. Patient or partner has history of genital herpes  Yes she stopped taking valtrex with +UPT. We discussed suppressive treatment at 36 weeks and episodic treatment vs daily suppression 3. Rash or viral illness since LMP  no 4. History of STI (GC, CT, HPV, syphilis, HIV)  no 5. History of recent travel :  no  Other pertinent information:  no    Review of Systems:10 point review of systems negative unless otherwise noted in HPI  Past Medical History:  Patient Active Problem List   Diagnosis Date Noted  . Encounter for supervision of normal first pregnancy in first trimester 12/24/2019    Clinic [facility] Prenatal Labs  Dating  Blood type: O/Positive/-- (04/13 0941)   Genetic Screen 1 Screen:    AFP:     Quad:     NIPS: Antibody:Negative (04/13 0941)  Anatomic [place]  Rubella: 2.30 (04/13  6213)  Varicella: Immune  GTT 28 wk:  RPR: Non Reactive (04/13 0941)   Rhogam  HBsAg: Negative (04/13 0941)   Vaccines TDAP:                       Flu Shot: HIV: Non Reactive (04/13 0941)   Baby Food Breast                               GBS:   Contraception  Pap: 08/05/19 NIL  CBB     CS/VBAC NA   Support Person Freida Busman       . Herpes 06/06/2019    Acyclovir     Past Surgical History:  Past Surgical History:  Procedure Laterality Date  . NO PAST SURGERIES      Gynecologic History: Patient's last menstrual period was 10/22/2019 (exact date).  Obstetric History:  G1P0000  Family History:  Family History  Problem Relation Age of Onset  . Diabetes Maternal Grandmother   . Heart disease Maternal Grandmother   . Diabetes Maternal Grandfather   . COPD Maternal Grandfather   . Breast cancer Paternal Grandmother   . Diabetes Paternal Grandmother   . Diabetes Paternal Grandfather   . Kidney disease Paternal Grandfather   . Hypertension Father   . Ovarian cancer Neg Hx   . Colon cancer Neg Hx     Social History:  Social History   Socioeconomic History  . Marital status: Single    Spouse name: Not on file  . Number of children: Not on file  . Years of education: Not on file  . Highest education level: Not on file  Occupational History  . Not on file  Tobacco Use  . Smoking status: Never Smoker  . Smokeless tobacco: Never Used  Substance and Sexual Activity  . Alcohol use: Yes    Comment: occas  . Drug use: No  . Sexual activity: Yes    Comment: mirena inserted 07/30/2017  Other Topics Concern  . Not on file  Social History Narrative  . Not on file   Social Determinants of Health   Financial Resource Strain:   . Difficulty of Paying Living Expenses:   Food Insecurity:   . Worried About Programme researcher, broadcasting/film/video in the Last Year:   . Barista in the Last Year:   Transportation Needs:   . Freight forwarder (Medical):   Marland Kitchen Lack of Transportation (Non-Medical):   Physical Activity:   . Days of Exercise per Week:   . Minutes of Exercise per Session:   Stress:   . Feeling of Stress :   Social Connections:   . Frequency of Communication with Friends and Family:   . Frequency of Social Gatherings with Friends and Family:   . Attends Religious Services:   . Active Member of Clubs or Organizations:   . Attends Banker Meetings:   Marland Kitchen Marital Status:   Intimate Partner Violence:   . Fear of Current or Ex-Partner:   . Emotionally Abused:   Marland Kitchen Physically Abused:   . Sexually Abused:     Allergies:  No Known  Allergies  Medications: Prior to Admission medications   Medication Sig Start Date End Date Taking? Authorizing Provider  Prenatal MV & Min w/FA-DHA ([facility] PRENATAL GUMMY PO) Take by mouth.   Yes [provider]  valACYclovir (VALTREX) 1000 MG tablet Take 1 tablet (1,000  mg total) by mouth 2 (two) times daily. Patient will need appointment before more refills authorized. 02/19/19  Yes Doreene Burke, CNM    Physical Exam Vitals: Blood pressure 118/80, weight 138 lb (62.6 kg), last menstrual period 10/22/2019.  General: NAD HEENT: normocephalic, anicteric Thyroid: no enlargement, no palpable nodules Pulmonary: No increased work of breathing, CTAB Cardiovascular: RRR, distal pulses 2+ Abdomen: NABS, soft, non-tender, non-distended.  Umbilicus without lesions.  No hepatomegaly, splenomegaly or masses palpable. No evidence of hernia  Genitourinary:  External: Normal external female genitalia.  Normal urethral meatus, normal  Bartholin's and Skene's glands.    Vagina: Normal vaginal mucosa, no evidence of prolapse.    Cervix: Grossly normal in appearance, no bleeding, no CMT  Uterus:  Non-enlarged, mobile, normal contour.    Adnexa: ovaries non-enlarged, no adnexal masses  Rectal: deferred Extremities: no edema, erythema, or tenderness Neurologic: Grossly intact Psychiatric: mood appropriate, affect full  The following were addressed during this visit:  Breastfeeding Education - Early initiation of breastfeeding    Comments: Keeps milk supply adequate, helps contract uterus and slow bleeding, and early milk is the perfect first food and is easy to digest.   - The importance of exclusive breastfeeding    Comments: Provides antibodies, Lower risk of breast and ovarian cancers, and type-2 diabetes,Helps your body recover, Reduced chance of SIDS.   - Risks of giving your baby anything other than breast milk if you are breastfeeding    Comments: Make the baby less content with  breastfeeds, may make my baby more susceptible to illness, and may reduce my milk supply.   - Nonpharmacological pain relief methods for labor    Comments: Deep breathing, focusing on pleasant things, movement and walking, heating pads or cold compress, massage and relaxation, continuous support from someone you trust, and Doulas   - The importance of early skin-to-skin contact    Comments: Keeps baby warm and secure, helps keep baby's blood sugar up and breathing steady, easier to bond and breastfeed, and helps calm baby.  - Rooming-in on a 24-hour basis    Comments: Easier to learn baby's feeding cues, easier to bond and get to know each other, and encourages milk production.   - Feeding on demand or baby-led feeding    Comments: Helps prevent breastfeeding complications, helps bring in good milk supply, prevents under or overfeeding, and helps baby feel content and satisfied   - Frequent feeding to help assure optimal milk production    Comments: Making a full supply of milk requires frequent removal of milk from breasts, infant will eat 8-12 times in 24 hours, if separated from infant use breast massage, hand expression and/ or pumping to remove milk from breasts.   - Effective positioning and attachment    Comments: Helps my baby to get enough breast milk, helps to produce an adequate milk supply, and helps prevent nipple pain and damage   - Exclusive breastfeeding for the first 6 months    Comments: Builds a healthy milk supply and keeps it up, protects baby from sickness and disease, and breastmilk has everything your baby needs for the first 6 months.    Assessment: 29 y.o. G1P0000 at [redacted]w[redacted]d by LMP presenting to initiate prenatal care  Plan: 1) Avoid alcoholic beverages. 2) Patient encouraged not to smoke.  3) Discontinue the use of all non-medicinal drugs and chemicals.  4) Take prenatal vitamins daily.  5) Nutrition, food safety (fish, cheese advisories, and high nitrite  foods) and exercise  discussed. 6) Hospital and practice style discussed with cross coverage system.  7) Genetic Screening, such as with 1st Trimester Screening, cell free fetal DNA, AFP testing, and Ultrasound, as well as with amniocentesis and CVS as appropriate, is discussed with patient. At the conclusion of today's visit patient requested cell free DNA genetic testing 8) Patient is asked about travel to areas at risk for the Zika virus, and counseled to avoid travel and exposure to mosquitoes or sexual partners who may have themselves been exposed to the virus. Testing is discussed, and will be ordered as appropriate.  9) Urine culture, aptima, NOB panel done today 10) Return to clinic in 1 week for dating scan and ROB 11) MaterniT 21 (if covered by [facility]) at 10+ weeks   Tresea Mall, CNM [facility] 12/24/2019, 10:14 AM

## 2019-12-25 LAB — CERVICOVAGINAL ANCILLARY ONLY
Chlamydia: NEGATIVE
Comment: NEGATIVE
Comment: NEGATIVE
Comment: NORMAL
Neisseria Gonorrhea: NEGATIVE
Trichomonas: NEGATIVE

## 2019-12-25 LAB — URINE CULTURE

## 2019-12-31 ENCOUNTER — Other Ambulatory Visit: Payer: Self-pay | Admitting: Advanced Practice Midwife

## 2019-12-31 DIAGNOSIS — O2341 Unspecified infection of urinary tract in pregnancy, first trimester: Secondary | ICD-10-CM

## 2019-12-31 MED ORDER — CEPHALEXIN 500 MG PO CAPS: 500.0000 mg | ORAL_CAPSULE | Freq: Three times a day (TID) | ORAL | 0 refills | Status: DC

## 2019-12-31 NOTE — Progress Notes (Unsigned)
Rx Keflex sent to treat E Coli in urine cx. MyChart message sent.

## 2020-01-05 ENCOUNTER — Ambulatory Visit: Payer: 59

## 2020-01-05 ENCOUNTER — Encounter: Payer: 59 | Admitting: Certified Nurse Midwife

## 2020-01-12 ENCOUNTER — Other Ambulatory Visit: Payer: Self-pay

## 2020-01-12 ENCOUNTER — Ambulatory Visit (INDEPENDENT_AMBULATORY_CARE_PROVIDER_SITE_OTHER): Payer: 59

## 2020-01-12 ENCOUNTER — Ambulatory Visit (INDEPENDENT_AMBULATORY_CARE_PROVIDER_SITE_OTHER): Payer: 59 | Admitting: Certified Nurse Midwife

## 2020-01-12 VITALS — BP 122/74 | Wt 138.0 lb

## 2020-01-12 DIAGNOSIS — Z3401 Encounter for supervision of normal first pregnancy, first trimester: Secondary | ICD-10-CM

## 2020-01-12 DIAGNOSIS — Z1379 Encounter for other screening for genetic and chromosomal anomalies: Secondary | ICD-10-CM

## 2020-01-12 DIAGNOSIS — Z3A11 11 weeks gestation of pregnancy: Secondary | ICD-10-CM

## 2020-01-12 NOTE — Progress Notes (Signed)
ROB and dating ultrasound at 11wk5d: Still having some smell aversions, but keeping down most of what she eats.  Desires genetic testing-first trimester test  Ultrasound: SIUP with CRL 11wk6d with FCA 163  No change in weight  A: IUP at 11wk5d  P: ROB and first trimester test in 1 week  Farrel Conners, CNM

## 2020-01-12 NOTE — Progress Notes (Signed)
Dating u/s today. No vb. No lof.  

## 2020-01-23 ENCOUNTER — Ambulatory Visit (INDEPENDENT_AMBULATORY_CARE_PROVIDER_SITE_OTHER): Payer: 59 | Admitting: Advanced Practice Midwife

## 2020-01-23 ENCOUNTER — Encounter: Payer: Self-pay | Admitting: Advanced Practice Midwife

## 2020-01-23 ENCOUNTER — Other Ambulatory Visit: Payer: Self-pay | Admitting: Certified Nurse Midwife

## 2020-01-23 ENCOUNTER — Other Ambulatory Visit: Payer: Self-pay

## 2020-01-23 ENCOUNTER — Ambulatory Visit (INDEPENDENT_AMBULATORY_CARE_PROVIDER_SITE_OTHER): Payer: 59

## 2020-01-23 VITALS — BP 100/60 | Wt 138.0 lb

## 2020-01-23 DIAGNOSIS — Z1379 Encounter for other screening for genetic and chromosomal anomalies: Secondary | ICD-10-CM

## 2020-01-23 DIAGNOSIS — Z3A13 13 weeks gestation of pregnancy: Secondary | ICD-10-CM

## 2020-01-23 DIAGNOSIS — Z3402 Encounter for supervision of normal first pregnancy, second trimester: Secondary | ICD-10-CM

## 2020-01-23 DIAGNOSIS — Z3401 Encounter for supervision of normal first pregnancy, first trimester: Secondary | ICD-10-CM

## 2020-01-23 LAB — POCT URINALYSIS DIPSTICK OB
Glucose, UA: NEGATIVE
POC,PROTEIN,UA: NEGATIVE

## 2020-01-23 NOTE — Progress Notes (Signed)
  Routine Prenatal Care Visit  Subjective  Sandra Arellano is a 29 y.o. G1P0000 at [redacted]w[redacted]d being seen today for ongoing prenatal care.  She is currently monitored for the following issues for this low-risk pregnancy and has Herpes and Encounter for supervision of normal first pregnancy in first trimester on their problem list.  ----------------------------------------------------------------------------------- Patient reports no complaints.  We discussed incomplete NT scan. She thinks her insurance will cover cell free DNA test. Will proceed with that test today.   . Vag. Bleeding: None.   . Leaking Fluid denies.  ----------------------------------------------------------------------------------- The following portions of the patient's history were reviewed and updated as appropriate: allergies, current medications, past family history, past medical history, past social history, past surgical history and problem list. Problem list updated.  Objective  Blood pressure 100/60, weight 138 lb (62.6 kg), last menstrual period 10/22/2019. Pregravid weight 133 lb (60.3 kg) Total Weight Gain 5 lb (2.268 kg) Urinalysis: Urine Protein    Urine Glucose    Fetal Status: Fetal Heart Rate (bpm): 156          NT scan: incomplete for NT measurement due to fetal position  General:  Alert, oriented and cooperative. Patient is in no acute distress.  Skin: Skin is warm and dry. No rash noted.   Cardiovascular: Normal heart rate noted  Respiratory: Normal respiratory effort, no problems with respiration noted  Abdomen: Soft, gravid, appropriate for gestational age. Pain/Pressure: Absent     Pelvic:  Cervical exam deferred        Extremities: Normal range of motion.     Mental Status: Normal mood and affect. Normal behavior. Normal judgment and thought content.   Assessment   29 y.o. G1P0000 at 110w2d by  07/28/2020, by Last Menstrual Period presenting for routine prenatal visit  Plan   MaterniT 21 today    Preterm labor symptoms and general obstetric precautions including but not limited to vaginal bleeding, contractions, leaking of fluid and fetal movement were reviewed in detail with the patient. Please refer to After Visit Summary for other counseling recommendations.   Return in about 4 weeks (around 02/20/2020) for rob.   Tresea Mall, CNM 01/23/2020 2:01 PM

## 2020-01-23 NOTE — Patient Instructions (Signed)

## 2020-01-28 LAB — MATERNIT 21 PLUS CORE, BLOOD
Fetal Fraction: 11
Result (T21): NEGATIVE
Trisomy 13 (Patau syndrome): NEGATIVE
Trisomy 18 (Edwards syndrome): NEGATIVE
Trisomy 21 (Down syndrome): NEGATIVE

## 2020-02-20 ENCOUNTER — Ambulatory Visit (INDEPENDENT_AMBULATORY_CARE_PROVIDER_SITE_OTHER): Payer: 59 | Admitting: Obstetrics & Gynecology

## 2020-02-20 ENCOUNTER — Encounter: Payer: Self-pay | Admitting: Obstetrics & Gynecology

## 2020-02-20 ENCOUNTER — Other Ambulatory Visit: Payer: Self-pay

## 2020-02-20 VITALS — BP 100/60 | Wt 144.0 lb

## 2020-02-20 DIAGNOSIS — Z3401 Encounter for supervision of normal first pregnancy, first trimester: Secondary | ICD-10-CM

## 2020-02-20 DIAGNOSIS — Z3402 Encounter for supervision of normal first pregnancy, second trimester: Secondary | ICD-10-CM

## 2020-02-20 DIAGNOSIS — Z3A17 17 weeks gestation of pregnancy: Secondary | ICD-10-CM

## 2020-02-20 DIAGNOSIS — Z3689 Encounter for other specified antenatal screening: Secondary | ICD-10-CM

## 2020-02-20 LAB — POCT URINALYSIS DIPSTICK OB
Glucose, UA: NEGATIVE
POC,PROTEIN,UA: NEGATIVE

## 2020-02-20 NOTE — Patient Instructions (Signed)
Prenatal Ultrasound A prenatal ultrasound exam, also called a sonogram, is an imaging test that allows your health care provider to see your baby and placenta in the uterus. This is a safe and painless test that does not expose you or your baby to any X-rays, needles, or medicines. Prenatal ultrasounds are done using a handheld plastic device (transducer) that sends out sound waves (ultrasound). The sound waves reflect off your baby's bones and other tissues to create moving images on a computer screen. There are two types of prenatal ultrasound:  Transabdominal ultrasound. During this test, a transducer is placed on your belly and moved around. A routine transabdominal ultrasound is usually done between weeks 18 and 22 of pregnancy (standard ultrasound). It may also be done between weeks 13 and 14.  Transvaginal ultrasound. During this test, a transducer that is shaped like a wand is placed inside your vagina. This type of ultrasound is usually done during early pregnancy. Prenatal ultrasounds may be used to check:  How far along your pregnancy is (stage).  Your baby's development (gestational age).  The location and condition of the organ that supplies your baby with nourishment and oxygen (placenta).  Your baby's heart rate, position, and movements.  Your baby's approximate size and weight.  The amount of fluid surrounding your baby (amniotic fluid).  If you are carrying more than one baby.  Your baby's sex (if your baby is in a position that allows the sex organs to be seen, and if you choose to learn the sex at this time).  If there are any possible problems that require more testing, such as genetic problems.  If your pregnancy is forming outside your uterus (ectopic pregnancy). You may have other ultrasounds as needed at any point during your pregnancy. If your health care provider suspects a problem, you may also have a more detailed type of transabdominal ultrasound (advanced  ultrasound). What are the risks? Generally, this is a safe test. There are no known risks for you or your baby from a prenatal ultrasound. What happens before the test?  Before a transabdominal ultrasound, you may be asked to drink fluid 2 hours before the exam and avoid emptying your bladder. A full bladder helps the images show up more clearly.  Before a transvaginal ultrasound, you may be asked to empty your bladder before the exam.  Wear loose, comfortable clothing so it is easy to undress or expose your lower belly for the exam. What happens during the test? If you are having a transabdominal ultrasound:  You will lie on an exam table.  Your belly will be exposed.  Gel will be rubbed over your belly.  The transducer will be pressed on your belly and moved back and forth, through the gel. You may feel slight pressure, but there should not be any pain.  You may be asked to change your position.  You may hear sounds of blood flow and your baby's heartbeat. You may be able to see images of your baby on the computer screen. Your health care provider may measure your baby's head and other body parts, looking for normal development.  After the exam, the gel will be cleaned off, and you can replace your clothing. You will be able to empty your bladder after the exam is done. If you are having a transvaginal ultrasound:  You will change into a hospital gown or undress from the waist down and cover yourself with a paper sheet.  You will lie down on   an exam table with your feet in footrests (stirrups).  The transducer will be covered with a protective cover and lubricated.  The transducer will be inserted into your vagina.  You may hear sounds of blood flow and your baby's heartbeat. You may be able to see images of your baby on the computer screen.  After the exam, the transducer will be removed, and you can put your clothes back on. What can I expect after the test?  You can  drive yourself home and return to all your normal activities.  A health care provider trained in interpreting ultrasounds will review the images taken during your exam and send a report to your health care provider.  It is up to you to get your test results. Ask your health care provider, or the department that is doing the test, when your results will be ready. Questions to ask your health care provider  Why am I having this prenatal ultrasound?  What information will this exam provide?  How much does this exam cost? What costs will my insurance cover?  Can my partner or support person be with me during the exam?  When can I expect to get the results? Summary  A prenatal ultrasound is a safe and painless imaging exam that gives information about your pregnancy and your developing baby.  Transvaginal ultrasound exams are often done in early pregnancy. Standard transabdominal ultrasounds are typically done between 18 and 22 weeks of pregnancy. You may have other prenatal ultrasounds as needed.  This exam has no risks for you or your baby. After the exam, you can go home and return to all your usual activities. This information is not intended to replace advice given to you by your health care provider. Make sure you discuss any questions you have with your health care provider. Document Revised: 12/20/2018 Document Reviewed: 10/31/2017 Elsevier Patient Education  2020 [employer]  

## 2020-02-20 NOTE — Addendum Note (Signed)
Addended by: Cornelius Moras D on: 02/20/2020 08:53 AM   Modules accepted: Orders

## 2020-02-20 NOTE — Progress Notes (Signed)
  Subjective  Fetal Movement? yes Pelvic pain? no Nausea? no Vaginal Bleeding? no  Objective  BP 100/60   Wt 144 lb (65.3 kg)   LMP 10/22/2019 (Exact Date)   BMI 26.34 kg/m  General: NAD Pumonary: no increased work of breathing Abdomen: gravid, non-tender Extremities: no edema Psychiatric: mood appropriate, affect full  Assessment  29 y.o. G1P0000 at [redacted]w[redacted]d by  07/28/2020, by Last Menstrual Period presenting for routine prenatal visit  Plan   Problem List Items Addressed This Visit    [redacted] weeks gestation of pregnancy         Cont PNV    Encounter for supervision of normal first pregnancy in second trimester       Screening, antenatal, for fetal anatomic survey       Relevant Orders   US OB Comp + 14 Wk (next visit)    Annamarie Major, MD, [facility], [facility] 02/20/2020  8:10 AM

## 2020-03-11 ENCOUNTER — Ambulatory Visit (INDEPENDENT_AMBULATORY_CARE_PROVIDER_SITE_OTHER): Payer: 59 | Admitting: Obstetrics & Gynecology

## 2020-03-11 ENCOUNTER — Ambulatory Visit (INDEPENDENT_AMBULATORY_CARE_PROVIDER_SITE_OTHER): Payer: 59

## 2020-03-11 ENCOUNTER — Encounter: Payer: Self-pay | Admitting: Obstetrics & Gynecology

## 2020-03-11 ENCOUNTER — Other Ambulatory Visit: Payer: Self-pay

## 2020-03-11 VITALS — BP 100/60 | Wt 146.0 lb

## 2020-03-11 DIAGNOSIS — Z3689 Encounter for other specified antenatal screening: Secondary | ICD-10-CM

## 2020-03-11 DIAGNOSIS — Z3A20 20 weeks gestation of pregnancy: Secondary | ICD-10-CM

## 2020-03-11 DIAGNOSIS — Z3402 Encounter for supervision of normal first pregnancy, second trimester: Secondary | ICD-10-CM

## 2020-03-11 DIAGNOSIS — Z131 Encounter for screening for diabetes mellitus: Secondary | ICD-10-CM

## 2020-03-11 LAB — POCT URINALYSIS DIPSTICK OB
Glucose, UA: NEGATIVE
POC,PROTEIN,UA: NEGATIVE

## 2020-03-11 NOTE — Progress Notes (Signed)
  Subjective  Fetal Movement? yes Contractions? no Leaking Fluid? no Vaginal Bleeding? no Min nausea Objective  BP 100/60   Wt 146 lb (66.2 kg)   LMP 10/22/2019 (Exact Date)   BMI 26.70 kg/m  General: NAD Pumonary: no increased work of breathing Abdomen: gravid, non-tender Extremities: no edema Psychiatric: mood appropriate, affect full  Assessment  29 y.o. G1P0000 at [redacted]w[redacted]d by  07/28/2020, by Last Menstrual Period presenting for routine prenatal visit  Plan   Problem List Items Addressed This Visit    None    Visit Diagnoses    [redacted] weeks gestation of pregnancy    -  Primary   Encounter for supervision of normal first pregnancy in second trimester       Screening for diabetes mellitus       Relevant Orders   28 Week RH+Panel    Review of ULTRASOUND. I have personally reviewed images and report of recent ultrasound done at [facility]. There is a singleton gestation with subjectively normal amniotic fluid volume. The fetal biometry correlates with established dating. Detailed evaluation of the fetal anatomy was performed.The fetal anatomical survey appears within normal limits within the resolution of ultrasound as described above.  It must be noted that a normal ultrasound is unable to rule out fetal aneuploidy.    PNV  Sandra Major, MD, [facility], [facility] 03/11/2020  9:51 AM

## 2020-03-11 NOTE — Addendum Note (Signed)
Addended by: Cornelius Moras D on: 03/11/2020 09:54 AM   Modules accepted: Orders

## 2020-03-11 NOTE — Patient Instructions (Signed)

## 2020-04-09 ENCOUNTER — Encounter: Payer: 59 | Admitting: Certified Nurse Midwife

## 2020-04-14 ENCOUNTER — Ambulatory Visit (INDEPENDENT_AMBULATORY_CARE_PROVIDER_SITE_OTHER): Payer: 59 | Admitting: Advanced Practice Midwife

## 2020-04-14 ENCOUNTER — Encounter: Payer: Self-pay | Admitting: Advanced Practice Midwife

## 2020-04-14 ENCOUNTER — Other Ambulatory Visit: Payer: Self-pay

## 2020-04-14 VITALS — BP 117/79 | Wt 155.0 lb

## 2020-04-14 DIAGNOSIS — Z3401 Encounter for supervision of normal first pregnancy, first trimester: Secondary | ICD-10-CM

## 2020-04-14 DIAGNOSIS — Z3A25 25 weeks gestation of pregnancy: Secondary | ICD-10-CM

## 2020-04-14 DIAGNOSIS — Z3402 Encounter for supervision of normal first pregnancy, second trimester: Secondary | ICD-10-CM

## 2020-04-14 NOTE — Progress Notes (Signed)
ROB

## 2020-04-14 NOTE — Progress Notes (Signed)
  Routine Prenatal Care Visit  Subjective  Sandra Arellano is a 29 y.o. G1P0000 at [redacted]w[redacted]d being seen today for ongoing prenatal care.  She is currently monitored for the following issues for this low-risk pregnancy and has Herpes and Encounter for supervision of normal first pregnancy in first trimester on their problem list.  ----------------------------------------------------------------------------------- Patient reports some numbness and tingling in her right hand.    . Vag. Bleeding: None.  Movement: Present. Leaking Fluid denies.  ----------------------------------------------------------------------------------- The following portions of the patient's history were reviewed and updated as appropriate: allergies, current medications, past family history, past medical history, past social history, past surgical history and problem list. Problem list updated.  Objective  Blood pressure 117/79, weight 155 lb (70.3 kg), last menstrual period 10/22/2019. Pregravid weight 133 lb (60.3 kg) Total Weight Gain 22 lb (9.979 kg) Urinalysis: Urine Protein    Urine Glucose    Fetal Status: Fetal Heart Rate (bpm): 153 Fundal Height: 25 cm Movement: Present     General:  Alert, oriented and cooperative. Patient is in no acute distress.  Skin: Skin is warm and dry. No rash noted.   Cardiovascular: Normal heart rate noted  Respiratory: Normal respiratory effort, no problems with respiration noted  Abdomen: Soft, gravid, appropriate for gestational age. Pain/Pressure: Absent     Pelvic:  Cervical exam deferred        Extremities: Normal range of motion.     Mental Status: Normal mood and affect. Normal behavior. Normal judgment and thought content.   Assessment   29 y.o. G1P0000 at [redacted]w[redacted]d by  07/28/2020, by Last Menstrual Period presenting for routine prenatal visit  Plan  Numbness/tingling in extremities: stretches, exercises, position changes, increase hydration   Preterm labor symptoms and general  obstetric precautions including but not limited to vaginal bleeding, contractions, leaking of fluid and fetal movement were reviewed in detail with the patient.   Return for scheduled prenatal visit.  Tresea Mall, CNM 04/14/2020 8:39 AM

## 2020-05-04 ENCOUNTER — Other Ambulatory Visit: Payer: 59

## 2020-05-04 ENCOUNTER — Ambulatory Visit (INDEPENDENT_AMBULATORY_CARE_PROVIDER_SITE_OTHER): Payer: 59 | Admitting: Obstetrics and Gynecology

## 2020-05-04 ENCOUNTER — Other Ambulatory Visit: Payer: Self-pay

## 2020-05-04 VITALS — BP 110/74 | Wt 159.0 lb

## 2020-05-04 DIAGNOSIS — Z3402 Encounter for supervision of normal first pregnancy, second trimester: Secondary | ICD-10-CM

## 2020-05-04 DIAGNOSIS — Z3A27 27 weeks gestation of pregnancy: Secondary | ICD-10-CM

## 2020-05-04 DIAGNOSIS — Z131 Encounter for screening for diabetes mellitus: Secondary | ICD-10-CM

## 2020-05-04 DIAGNOSIS — Z3401 Encounter for supervision of normal first pregnancy, first trimester: Secondary | ICD-10-CM

## 2020-05-04 LAB — POCT URINALYSIS DIPSTICK OB
Glucose, UA: NEGATIVE
POC,PROTEIN,UA: NEGATIVE

## 2020-05-04 NOTE — Progress Notes (Signed)
° ° °  Routine Prenatal Care Visit  Subjective  Sandra Arellano is a 29 y.o. G1P0000 at [redacted]w[redacted]d being seen today for ongoing prenatal care.  She is currently monitored for the following issues for this high-risk pregnancy and has Herpes and Encounter for supervision of normal first pregnancy in first trimester on their problem list.  ----------------------------------------------------------------------------------- Patient reports no complaints.   Contractions: Not present. Vag. Bleeding: None.  Movement: Present. Denies leaking of fluid.  ----------------------------------------------------------------------------------- The following portions of the patient's history were reviewed and updated as appropriate: allergies, current medications, past family history, past medical history, past social history, past surgical history and problem list. Problem list updated.   Objective  Blood pressure 110/74, weight 159 lb (72.1 kg), last menstrual period 10/22/2019. Pregravid weight 133 lb (60.3 kg) Total Weight Gain 26 lb (11.8 kg) Urinalysis:      Fetal Status: Fetal Heart Rate (bpm): 150 Fundal Height: 27 cm Movement: Present  Presentation: Vertex  General:  Alert, oriented and cooperative. Patient is in no acute distress.  Skin: Skin is warm and dry. No rash noted.   Cardiovascular: Normal heart rate noted  Respiratory: Normal respiratory effort, no problems with respiration noted  Abdomen: Soft, gravid, appropriate for gestational age. Pain/Pressure: Absent     Pelvic:  Cervical exam deferred        Extremities: Normal range of motion.     ental Status: Normal mood and affect. Normal behavior. Normal judgment and thought content.     Assessment   29 y.o. G1P0000 at [redacted]w[redacted]d by  07/28/2020, by Last Menstrual Period presenting for routine prenatal visit  Plan   Pregnancy #1 Problems (from 12/23/19 to present)    Problem Noted Resolved   Encounter for supervision of normal first pregnancy in  first trimester 12/24/2019 by Tresea Mall, CNM No   Overview Addendum 02/20/2020  8:09 AM by Nadara Mustard, MD    Clinic [facility] Prenatal Labs  Dating LMP=11wk [place] Blood type: O/Positive/-- (04/13 0941)   Genetic Screen NIPS:bml XY Antibody:Negative (04/13 0941)  Anatomic [place]  Rubella: 2.30 (04/13 0941)  Varicella: Immune  GTT 28 wk:  RPR: Non Reactive (04/13 0941)   Rhogam n/a HBsAg: Negative (04/13 0941)   Vaccines TDAP:                       Flu Shot: HIV: Non Reactive (04/13 0941)   Baby Food Breast                               GBS:   Contraception  Pap: 08/05/19 NIL  CBB  no   CS/VBAC NA   Support Person Sandra Arellano          Previous Version       Gestational age appropriate obstetric precautions including but not limited to vaginal bleeding, contractions, leaking of fluid and fetal movement were reviewed in detail with the patient.    Return in about 2 weeks (around 05/18/2020) for ROB.  Vena Austria, MD, [facility], [facility] 05/04/2020, 9:19 AM

## 2020-05-04 NOTE — Progress Notes (Signed)
ROB 28 week labs 

## 2020-05-05 LAB — 28 WEEK RH+PANEL
Basophils Absolute: 0.1 10*3/uL (ref 0.0–0.2)
Basos: 0 %
EOS (ABSOLUTE): 0.1 10*3/uL (ref 0.0–0.4)
Eos: 1 %
Gestational Diabetes Screen: 128 mg/dL (ref 65–139)
HIV Screen 4th Generation wRfx: NONREACTIVE
Hematocrit: 38.3 % (ref 34.0–46.6)
Hemoglobin: 12.4 g/dL (ref 11.1–15.9)
Immature Grans (Abs): 0.1 10*3/uL (ref 0.0–0.1)
Immature Granulocytes: 1 %
Lymphocytes Absolute: 1.7 10*3/uL (ref 0.7–3.1)
Lymphs: 14 %
MCH: 30.5 pg (ref 26.6–33.0)
MCHC: 32.4 g/dL (ref 31.5–35.7)
MCV: 94 fL (ref 79–97)
Monocytes Absolute: 0.8 10*3/uL (ref 0.1–0.9)
Monocytes: 7 %
Neutrophils Absolute: 9.3 10*3/uL — ABNORMAL HIGH (ref 1.4–7.0)
Neutrophils: 77 %
Platelets: 163 10*3/uL (ref 150–450)
RBC: 4.07 x10E6/uL (ref 3.77–5.28)
RDW: 11.8 % (ref 11.7–15.4)
RPR Ser Ql: NONREACTIVE
WBC: 12.1 10*3/uL — ABNORMAL HIGH (ref 3.4–10.8)

## 2020-05-18 ENCOUNTER — Encounter: Payer: Self-pay | Admitting: Advanced Practice Midwife

## 2020-05-18 ENCOUNTER — Ambulatory Visit (INDEPENDENT_AMBULATORY_CARE_PROVIDER_SITE_OTHER): Payer: 59 | Admitting: Advanced Practice Midwife

## 2020-05-18 ENCOUNTER — Other Ambulatory Visit: Payer: Self-pay

## 2020-05-18 VITALS — BP 100/70 | Wt 160.0 lb

## 2020-05-18 DIAGNOSIS — Z23 Encounter for immunization: Secondary | ICD-10-CM | POA: Diagnosis not present

## 2020-05-18 DIAGNOSIS — Z3403 Encounter for supervision of normal first pregnancy, third trimester: Secondary | ICD-10-CM

## 2020-05-18 DIAGNOSIS — Z3A29 29 weeks gestation of pregnancy: Secondary | ICD-10-CM

## 2020-05-18 NOTE — Addendum Note (Signed)
Addended by: Cornelius Moras D on: 05/18/2020 08:33 AM   Modules accepted: Orders

## 2020-05-18 NOTE — Progress Notes (Signed)
  Routine Prenatal Care Visit  Subjective  Sandra Arellano is a 29 y.o. G1P0000 at [redacted]w[redacted]d being seen today for ongoing prenatal care.  She is currently monitored for the following issues for this low-risk pregnancy and has Herpes and Encounter for supervision of normal first pregnancy in first trimester on their problem list.  ----------------------------------------------------------------------------------- Patient reports no complaints.    . Vag. Bleeding: None.  Movement: Present. Leaking Fluid denies.  ----------------------------------------------------------------------------------- The following portions of the patient's history were reviewed and updated as appropriate: allergies, current medications, past family history, past medical history, past social history, past surgical history and problem list. Problem list updated.  Objective  Blood pressure 100/70, weight 160 lb (72.6 kg), last menstrual period 10/22/2019. Pregravid weight 133 lb (60.3 kg) Total Weight Gain 27 lb (12.2 kg) Urinalysis: Urine Protein    Urine Glucose    Fetal Status: Fetal Heart Rate (bpm): 145 Fundal Height: 30 cm Movement: Present     General:  Alert, oriented and cooperative. Patient is in no acute distress.  Skin: Skin is warm and dry. No rash noted.   Cardiovascular: Normal heart rate noted  Respiratory: Normal respiratory effort, no problems with respiration noted  Abdomen: Soft, gravid, appropriate for gestational age. Pain/Pressure: Absent     Pelvic:  Cervical exam deferred        Extremities: Normal range of motion.     Mental Status: Normal mood and affect. Normal behavior. Normal judgment and thought content.   Assessment   29 y.o. G1P0000 at [redacted]w[redacted]d by  07/28/2020, by Last Menstrual Period presenting for routine prenatal visit  Plan   Pregnancy #1 Problems (from 12/23/19 to present)    Problem Noted Resolved   Encounter for supervision of normal first pregnancy in first trimester 12/24/2019 by  Tresea Mall, CNM No   Overview Addendum 05/18/2020  8:15 AM by Tresea Mall, CNM    Clinic [facility] Prenatal Labs  Dating LMP=11wk [place] Blood type: O/Positive/-- (04/13 0941)   Genetic Screen NIPS:bml XY Antibody:Negative (04/13 0941)  Anatomic [place]  Rubella: 2.30 (04/13 0941)  Varicella: Immune  GTT 28 wk:  RPR: Non Reactive (04/13 0941)   Rhogam n/a HBsAg: Negative (04/13 0941)   Vaccines TDAP:  05/18/20                   Flu Shot: HIV: Non Reactive (04/13 0941)   Baby Food Breast                               GBS:   Contraception  Pap: 08/05/19 NIL  CBB  no   CS/VBAC NA   Support Person Freida Busman          Previous Version       Preterm labor symptoms and general obstetric precautions including but not limited to vaginal bleeding, contractions, leaking of fluid and fetal movement were reviewed in detail with the patient. Please refer to After Visit Summary for other counseling recommendations.   Return in about 2 weeks (around 06/01/2020) for rob.  Tresea Mall, CNM 05/18/2020 8:28 AM

## 2020-05-18 NOTE — Patient Instructions (Signed)
Third Trimester of Pregnancy The third trimester is from week 28 through week 40 (months 7 through 9). The third trimester is a time when the unborn baby (fetus) is growing rapidly. At the end of the ninth month, the fetus is about 20 inches in length and weighs 6-10 pounds. Body changes during your third trimester Your body will continue to go through many changes during pregnancy. The changes vary from woman to woman. During the third trimester:  Your weight will continue to increase. You can expect to gain 25-35 pounds (11-16 kg) by the end of the pregnancy.  You may begin to get stretch marks on your hips, abdomen, and breasts.  You may urinate more often because the fetus is moving lower into your pelvis and pressing on your bladder.  You may develop or continue to have heartburn. This is caused by increased hormones that slow down muscles in the digestive tract.  You may develop or continue to have constipation because increased hormones slow digestion and cause the muscles that push waste through your intestines to relax.  You may develop hemorrhoids. These are swollen veins (varicose veins) in the rectum that can itch or be painful.  You may develop swollen, bulging veins (varicose veins) in your legs.  You may have increased body aches in the pelvis, back, or thighs. This is due to weight gain and increased hormones that are relaxing your joints.  You may have changes in your hair. These can include thickening of your hair, rapid growth, and changes in texture. Some women also have hair loss during or after pregnancy, or hair that feels dry or thin. Your hair will most likely return to normal after your baby is born.  Your breasts will continue to grow and they will continue to become tender. A yellow fluid (colostrum) may leak from your breasts. This is the first milk you are producing for your baby.  Your belly button may stick out.  You may notice more swelling in your hands,  face, or ankles.  You may have increased tingling or numbness in your hands, arms, and legs. The skin on your belly may also feel numb.  You may feel short of breath because of your expanding uterus.  You may have more problems sleeping. This can be caused by the size of your belly, increased need to urinate, and an increase in your body's metabolism.  You may notice the fetus "dropping," or moving lower in your abdomen (lightening).  You may have increased vaginal discharge.  You may notice your joints feel loose and you may have pain around your pelvic bone. What to expect at prenatal visits You will have prenatal exams every 2 weeks until week 36. Then you will have weekly prenatal exams. During a routine prenatal visit:  You will be weighed to make sure you and the baby are growing normally.  Your blood pressure will be taken.  Your abdomen will be measured to track your baby's growth.  The fetal heartbeat will be listened to.  Any test results from the previous visit will be discussed.  You may have a cervical check near your due date to see if your cervix has softened or thinned (effaced).  You will be tested for Group B streptococcus. This happens between 35 and 37 weeks. Your health care provider may ask you:  What your birth plan is.  How you are feeling.  If you are feeling the baby move.  If you have had any abnormal   symptoms, such as leaking fluid, bleeding, severe headaches, or abdominal cramping.  If you are using any tobacco products, including cigarettes, chewing tobacco, and electronic cigarettes.  If you have any questions. Other tests or screenings that may be performed during your third trimester include:  Blood tests that check for low iron levels (anemia).  Fetal testing to check the health, activity level, and growth of the fetus. Testing is done if you have certain medical conditions or if there are problems during the pregnancy.  Nonstress test  (NST). This test checks the health of your baby to make sure there are no signs of problems, such as the baby not getting enough oxygen. During this test, a belt is placed around your belly. The baby is made to move, and its heart rate is monitored during movement. What is false labor? False labor is a condition in which you feel small, irregular tightenings of the muscles in the womb (contractions) that usually go away with rest, changing position, or drinking water. These are called Braxton Hicks contractions. Contractions may last for hours, days, or even weeks before true labor sets in. If contractions come at regular intervals, become more frequent, increase in intensity, or become painful, you should see your health care provider. What are the signs of labor?  Abdominal cramps.  Regular contractions that start at 10 minutes apart and become stronger and more frequent with time.  Contractions that start on the top of the uterus and spread down to the lower abdomen and back.  Increased pelvic pressure and dull back pain.  A watery or bloody mucus discharge that comes from the vagina.  Leaking of amniotic fluid. This is also known as your "water breaking." It could be a slow trickle or a gush. Let your health care provider know if it has a color or strange odor. If you have any of these signs, call your health care provider right away, even if it is before your due date. Follow these instructions at home: Medicines  Follow your health care provider's instructions regarding medicine use. Specific medicines may be either safe or unsafe to take during pregnancy.  Take a prenatal vitamin that contains at least 600 micrograms (mcg) of folic acid.  If you develop constipation, try taking a stool softener if your health care provider approves. Eating and drinking   Eat a balanced diet that includes fresh fruits and vegetables, whole grains, good sources of protein such as meat, eggs, or tofu,  and low-fat dairy. Your health care provider will help you determine the amount of weight gain that is right for you.  Avoid raw meat and uncooked cheese. These carry germs that can cause birth defects in the baby.  If you have low calcium intake from food, talk to your health care provider about whether you should take a daily calcium supplement.  Eat four or five small meals rather than three large meals a day.  Limit foods that are high in fat and processed sugars, such as fried and sweet foods.  To prevent constipation: ? Drink enough fluid to keep your urine clear or pale yellow. ? Eat foods that are high in fiber, such as fresh fruits and vegetables, whole grains, and beans. Activity  Exercise only as directed by your health care provider. Most women can continue their usual exercise routine during pregnancy. Try to exercise for 30 minutes at least 5 days a week. Stop exercising if you experience uterine contractions.  Avoid heavy lifting.  Do   not exercise in extreme heat or humidity, or at high altitudes.  Wear low-heel, comfortable shoes.  Practice good posture.  You may continue to have sex unless your health care provider tells you otherwise. Relieving pain and discomfort  Take frequent breaks and rest with your legs elevated if you have leg cramps or low back pain.  Take warm sitz baths to soothe any pain or discomfort caused by hemorrhoids. Use hemorrhoid cream if your health care provider approves.  Wear a good support bra to prevent discomfort from breast tenderness.  If you develop varicose veins: ? Wear support pantyhose or compression stockings as told by your healthcare provider. ? Elevate your feet for 15 minutes, 3-4 times a day. Prenatal care  Write down your questions. Take them to your prenatal visits.  Keep all your prenatal visits as told by your health care provider. This is important. Safety  Wear your seat belt at all times when driving.  Make  a list of emergency phone numbers, including numbers for family, friends, the hospital, and police and fire departments. General instructions  Avoid cat litter boxes and soil used by cats. These carry germs that can cause birth defects in the baby. If you have a cat, ask someone to clean the litter box for you.  Do not travel far distances unless it is absolutely necessary and only with the approval of your health care provider.  Do not use hot tubs, steam rooms, or saunas.  Do not drink alcohol.  Do not use any products that contain nicotine or tobacco, such as cigarettes and e-cigarettes. If you need help quitting, ask your health care provider.  Do not use any medicinal herbs or unprescribed drugs. These chemicals affect the formation and growth of the baby.  Do not douche or use tampons or scented sanitary pads.  Do not cross your legs for long periods of time.  To prepare for the arrival of your baby: ? Take prenatal classes to understand, practice, and ask questions about labor and delivery. ? Make a trial run to the hospital. ? Visit the hospital and tour the maternity area. ? Arrange for maternity or paternity leave through employers. ? Arrange for family and friends to take care of pets while you are in the hospital. ? Purchase a rear-facing car seat and make sure you know how to install it in your car. ? Pack your hospital bag. ? Prepare the baby's nursery. Make sure to remove all pillows and stuffed animals from the baby's crib to prevent suffocation.  Visit your dentist if you have not gone during your pregnancy. Use a soft toothbrush to brush your teeth and be gentle when you floss. Contact a health care provider if:  You are unsure if you are in labor or if your water has broken.  You become dizzy.  You have mild pelvic cramps, pelvic pressure, or nagging pain in your abdominal area.  You have lower back pain.  You have persistent nausea, vomiting, or  diarrhea.  You have an unusual or bad smelling vaginal discharge.  You have pain when you urinate. Get help right away if:  Your water breaks before 37 weeks.  You have regular contractions less than 5 minutes apart before 37 weeks.  You have a fever.  You are leaking fluid from your vagina.  You have spotting or bleeding from your vagina.  You have severe abdominal pain or cramping.  You have rapid weight loss or weight gain.  You have   shortness of breath with chest pain.  You notice sudden or extreme swelling of your face, hands, ankles, feet, or legs.  Your baby makes fewer than 10 movements in 2 hours.  You have severe headaches that do not go away when you take medicine.  You have vision changes. Summary  The third trimester is from week 28 through week 40, months 7 through 9. The third trimester is a time when the unborn baby (fetus) is growing rapidly.  During the third trimester, your discomfort may increase as you and your baby continue to gain weight. You may have abdominal, leg, and back pain, sleeping problems, and an increased need to urinate.  During the third trimester your breasts will keep growing and they will continue to become tender. A yellow fluid (colostrum) may leak from your breasts. This is the first milk you are producing for your baby.  False labor is a condition in which you feel small, irregular tightenings of the muscles in the womb (contractions) that eventually go away. These are called Braxton Hicks contractions. Contractions may last for hours, days, or even weeks before true labor sets in.  Signs of labor can include: abdominal cramps; regular contractions that start at 10 minutes apart and become stronger and more frequent with time; watery or bloody mucus discharge that comes from the vagina; increased pelvic pressure and dull back pain; and leaking of amniotic fluid. This information is not intended to replace advice given to you by your  health care provider. Make sure you discuss any questions you have with your health care provider. Document Revised: 12/19/2018 Document Reviewed: 10/03/2016 Elsevier Patient Education  2020 Elsevier Inc.  

## 2020-06-01 ENCOUNTER — Other Ambulatory Visit: Payer: Self-pay

## 2020-06-01 ENCOUNTER — Ambulatory Visit (INDEPENDENT_AMBULATORY_CARE_PROVIDER_SITE_OTHER): Payer: 59 | Admitting: Obstetrics and Gynecology

## 2020-06-01 VITALS — BP 124/66 | Wt 161.0 lb

## 2020-06-01 DIAGNOSIS — Z3401 Encounter for supervision of normal first pregnancy, first trimester: Secondary | ICD-10-CM

## 2020-06-01 DIAGNOSIS — Z3A31 31 weeks gestation of pregnancy: Secondary | ICD-10-CM

## 2020-06-01 NOTE — Progress Notes (Signed)
ROB

## 2020-06-01 NOTE — Progress Notes (Signed)
° ° °  Routine Prenatal Care Visit  Subjective  Sandra Arellano is a 29 y.o. G1P0000 at [redacted]w[redacted]d being seen today for ongoing prenatal care.  She is currently monitored for the following issues for this high-risk pregnancy and has Herpes and Encounter for supervision of normal first pregnancy in first trimester on their problem list.  ----------------------------------------------------------------------------------- Patient reports no complaints.   Contractions: Not present. Vag. Bleeding: None.  Movement: Present. Denies leaking of fluid.  ----------------------------------------------------------------------------------- The following portions of the patient's history were reviewed and updated as appropriate: allergies, current medications, past family history, past medical history, past social history, past surgical history and problem list. Problem list updated.   Objective  Blood pressure 124/66, weight 161 lb (73 kg), last menstrual period 10/22/2019. Pregravid weight 133 lb (60.3 kg) Total Weight Gain 28 lb (12.7 kg) Urinalysis:      Fetal Status: Fetal Heart Rate (bpm): 140 Fundal Height: 31 cm Movement: Present  Presentation: Vertex  General:  Alert, oriented and cooperative. Patient is in no acute distress.  Skin: Skin is warm and dry. No rash noted.   Cardiovascular: Normal heart rate noted  Respiratory: Normal respiratory effort, no problems with respiration noted  Abdomen: Soft, gravid, appropriate for gestational age. Pain/Pressure: Absent     Pelvic:  Cervical exam deferred        Extremities: Normal range of motion.     ental Status: Normal mood and affect. Normal behavior. Normal judgment and thought content.     Assessment   29 y.o. G1P0000 at [redacted]w[redacted]d by  07/28/2020, by Last Menstrual Period presenting for routine prenatal visit  Plan   Pregnancy #1 Problems (from 12/23/19 to present)    Problem Noted Resolved   Encounter for supervision of normal first pregnancy in  first trimester 12/24/2019 by Tresea Mall, CNM No   Overview Addendum 05/18/2020  8:15 AM by Tresea Mall, CNM    [facility]inic [facility] Prenatal Labs  Dating LMP=11wk [place] Blood type: O/Positive/-- (04/13 0941)   Genetic Screen NIPS:bml XY Antibody:Negative (04/13 0941)  Anatomic [place]  Rubella: 2.30 (04/13 0941)  Varicella: Immune  GTT 28 wk: 128 RPR: Non Reactive (04/13 0941)   Rhogam n/a HBsAg: Negative (04/13 0941)   Vaccines TDAP:  05/18/20                   Flu Shot: HIV: Non Reactive (04/13 0941)   Baby Food Breast                               GBS:   Contraception  Pap: 08/05/19 NIL  CBB  no   CS/VBAC NA   Support Person Freida Busman          Previous Version       Gestational age appropriate obstetric precautions including but not limited to vaginal bleeding, contractions, leaking of fluid and fetal movement were reviewed in detail with the patient.    Return in about 2 weeks (around 06/15/2020) for ROB.  Vena Austria, MD, [facility], [facility] 06/01/2020, 9:27 AM

## 2020-06-15 ENCOUNTER — Encounter: Payer: Self-pay | Admitting: Obstetrics & Gynecology

## 2020-06-15 ENCOUNTER — Other Ambulatory Visit: Payer: Self-pay

## 2020-06-15 ENCOUNTER — Ambulatory Visit (INDEPENDENT_AMBULATORY_CARE_PROVIDER_SITE_OTHER): Payer: 59 | Admitting: Obstetrics & Gynecology

## 2020-06-15 VITALS — BP 116/72 | Wt 168.8 lb

## 2020-06-15 DIAGNOSIS — Z3403 Encounter for supervision of normal first pregnancy, third trimester: Secondary | ICD-10-CM

## 2020-06-15 DIAGNOSIS — Z3A33 33 weeks gestation of pregnancy: Secondary | ICD-10-CM

## 2020-06-15 NOTE — Patient Instructions (Signed)
Thank you for choosing [facility]. As part of our ongoing efforts to improve patient experience, we would appreciate your feedback. Please fill out the short survey that you will receive by mail or MyChart. Your opinion is important to [place]! -Dr Tiburcio Pea  Influenza (Flu) Vaccine (Inactivated or Recombinant): What You Need to Know 1. Why get vaccinated? Influenza vaccine can prevent influenza (flu). Flu is a contagious disease that spreads around the [place] every year, usually between October and May. Anyone can get the flu, but it is more dangerous for some people. Infants and young children, people 54 years of age and older, pregnant women, and people with certain health conditions or a weakened immune system are at greatest risk of flu complications. Pneumonia, bronchitis, sinus infections and ear infections are examples of flu-related complications. If you have a medical condition, such as heart disease, cancer or diabetes, flu can make it worse. Flu can cause fever and chills, sore throat, muscle aches, fatigue, cough, headache, and runny or stuffy nose. Some people may have vomiting and diarrhea, though this is more common in children than adults. Each year thousands of people in the [place] die from flu, and many more are hospitalized. Flu vaccine prevents millions of illnesses and flu-related visits to the doctor each year. 2. Influenza vaccine CDC recommends everyone 45 months of age and older get vaccinated every flu season. Children 6 months through 75 years of age may need 2 doses during a single flu season. Everyone else needs only 1 dose each flu season. It takes about 2 weeks for protection to develop after vaccination. There are many flu viruses, and they are always changing. Each year a new flu vaccine is made to protect against three or four viruses that are likely to cause disease in the upcoming flu season. Even when the vaccine doesn't exactly match these viruses, it may  still provide some protection. Influenza vaccine does not cause flu. Influenza vaccine may be given at the same time as other vaccines. 3. Talk with your health care provider Tell your vaccine provider if the person getting the vaccine:  Has had an allergic reaction after a previous dose of influenza vaccine, or has any severe, life-threatening allergies.  Has ever had Guillain-Barr Syndrome (also called GBS). In some cases, your health care provider may decide to postpone influenza vaccination to a future visit. People with minor illnesses, such as a cold, may be vaccinated. People who are moderately or severely ill should usually wait until they recover before getting influenza vaccine. Your health care provider can give you more information. 4. Risks of a vaccine reaction  Soreness, redness, and swelling where shot is given, fever, muscle aches, and headache can happen after influenza vaccine.  There may be a very small increased risk of Guillain-Barr Syndrome (GBS) after inactivated influenza vaccine (the flu shot). Young children who get the flu shot along with pneumococcal vaccine (PCV13), and/or DTaP vaccine at the same time might be slightly more likely to have a seizure caused by fever. Tell your health care provider if a child who is getting flu vaccine has ever had a seizure. People sometimes faint after medical procedures, including vaccination. Tell your provider if you feel dizzy or have vision changes or ringing in the ears. As with any medicine, there is a very remote chance of a vaccine causing a severe allergic reaction, other serious injury, or death. 5. What if there is a serious problem? An allergic reaction could occur after  the vaccinated person leaves the clinic. If you see signs of a severe allergic reaction (hives, swelling of the face and throat, difficulty breathing, a fast heartbeat, dizziness, or weakness), call 9-1-1 and get the person to the nearest  hospital. °For other signs that concern you, call your health care provider. °Adverse reactions should be reported to the Vaccine Adverse Event Reporting System (VAERS). Your health care provider will usually file this report, or you can do it yourself. Visit the VAERS website at www.vaers.hhs.gov or call 1-800-822-7967.VAERS is only for reporting reactions, and VAERS staff do not give medical advice. °6. The National Vaccine Injury Compensation Program °The National Vaccine Injury Compensation Program (VICP) is a federal program that was created to compensate people who may have been injured by certain vaccines. Visit the VICP website at www.hrsa.gov/vaccinecompensation or call 1-800-338-2382 to learn about the program and about filing a claim. There is a time limit to file a claim for compensation. °7. How can I learn more? °· Ask your healthcare provider. °· Call your local or state health department. °· Contact the Centers for Disease Control and Prevention (CDC): °? Call 1-800-232-4636 (1-800-CDC-INFO) or °? Visit CDC's www.cdc.gov/flu °Vaccine Information Statement (Interim) Inactivated Influenza Vaccine (04/25/2018) °This information is not intended to replace advice given to you by your health care provider. Make sure you discuss any questions you have with your health care provider. °Document Revised: 12/17/2018 Document Reviewed: 04/29/2018 °Elsevier Patient Education © 2020 Elsevier Inc. ° °

## 2020-06-15 NOTE — Progress Notes (Signed)
°  Subjective  Fetal Movement? yes Contractions? no Leaking Fluid? no Vaginal Bleeding? no  Objective  BP 116/72    Wt 168 lb 12.8 oz (76.6 kg)    LMP 10/22/2019 (Exact Date)    BMI 30.87 kg/m  General: NAD Pumonary: no increased work of breathing Abdomen: gravid, non-tender Extremities: no edema Psychiatric: mood appropriate, affect full  Assessment  29 y.o. G1P0000 at [redacted]w[redacted]d by  07/28/2020, by Last Menstrual Period presenting for routine prenatal visit  Plan   Problem List Items Addressed This Visit      Other   Encounter for supervision of normal first pregnancy in first trimester    Other Visit Diagnoses    [redacted] weeks gestation of pregnancy    -  Primary      Pregnancy #1 Problems (from 12/23/19 to present)    Problem Noted Resolved   Encounter for supervision of normal first pregnancy in first trimester 12/24/2019 by Tresea Mall, CNM No   Overview Addendum 06/15/2020  9:46 AM by Nadara Mustard, MD    [facility]inic [facility] Prenatal Labs  Dating LMP=11wk [place] Blood type: O/Positive/-- (04/13 0941)   Genetic Screen NIPS:bml XY Antibody:Negative (04/13 0941)  Anatomic [place] [facility], nml Rubella: 2.30 (04/13 0941)  Varicella: Immune  GTT 28 wk: 128 RPR: Non Reactive (04/13 0941)   Rhogam n/a HBsAg: Negative (04/13 0941)   Vaccines TDAP:  05/18/20                   Flu Shot:declines HIV: Non Reactive (04/13 0941)   Baby Food Breast                               GBS:   Contraception POP Pap: 08/05/19 NIL  CBB  no   CS/VBAC NA   Support Person Freida Busman          The following were addressed during this visit:  Breastfeeding Education - Individualized Education   32-35 weeks - Printmaker / Tubal Consent  - Feeding Plans  - Warning Signs - PIH  - Fetal Growth and Movement   PNV, [facility] Contraception discussed  Annamarie Major, MD, [facility], [facility] 06/15/2020  9:46 AM

## 2020-06-29 ENCOUNTER — Other Ambulatory Visit: Payer: Self-pay

## 2020-06-29 ENCOUNTER — Ambulatory Visit (INDEPENDENT_AMBULATORY_CARE_PROVIDER_SITE_OTHER): Payer: 59 | Admitting: Obstetrics and Gynecology

## 2020-06-29 VITALS — BP 124/82 | Wt 173.0 lb

## 2020-06-29 DIAGNOSIS — Z3A35 35 weeks gestation of pregnancy: Secondary | ICD-10-CM

## 2020-06-29 DIAGNOSIS — Z3685 Encounter for antenatal screening for Streptococcus B: Secondary | ICD-10-CM

## 2020-06-29 DIAGNOSIS — Z3401 Encounter for supervision of normal first pregnancy, first trimester: Secondary | ICD-10-CM

## 2020-06-29 LAB — POCT URINALYSIS DIPSTICK OB
Glucose, UA: NEGATIVE
POC,PROTEIN,UA: NEGATIVE

## 2020-06-29 NOTE — Progress Notes (Signed)
C-

## 2020-06-29 NOTE — Progress Notes (Signed)
    Routine Prenatal Care Visit  Subjective  Sandra Arellano is a 29 y.o. G1P0000 at [redacted]w[redacted]d being seen today for ongoing prenatal care.  She is currently monitored for the following issues for this low-risk pregnancy and has Herpes and Encounter for supervision of normal first pregnancy in first trimester on their problem list.  ----------------------------------------------------------------------------------- Patient reports no complaints.   Contractions: Irregular. Vag. Bleeding: None.  Movement: Present. Denies leaking of fluid.  ----------------------------------------------------------------------------------- The following portions of the patient's history were reviewed and updated as appropriate: allergies, current medications, past family history, past medical history, past social history, past surgical history and problem list. Problem list updated.   Objective  Blood pressure 124/82, weight 173 lb (78.5 kg), last menstrual period 10/22/2019. Pregravid weight 133 lb (60.3 kg) Total Weight Gain 40 lb (18.1 kg) Urinalysis:      Fetal Status: Fetal Heart Rate (bpm): 145 Fundal Height: 35 cm Movement: Present  Presentation: Vertex  General:  Alert, oriented and cooperative. Patient is in no acute distress.  Skin: Skin is warm and dry. No rash noted.   Cardiovascular: Normal heart rate noted  Respiratory: Normal respiratory effort, no problems with respiration noted  Abdomen: Soft, gravid, appropriate for gestational age. Pain/Pressure: Absent     Pelvic:  Cervical exam deferred Dilation: Closed Effacement (%): 50 Station: -3  Extremities: Normal range of motion.     ental Status: Normal mood and affect. Normal behavior. Normal judgment and thought content.     Assessment   29 y.o. G1P0000 at [redacted]w[redacted]d by  07/28/2020, by Last Menstrual Period presenting for routine prenatal visit  Plan   Pregnancy #1 Problems (from 12/23/19 to present)    Problem Noted Resolved   Encounter for  supervision of normal first pregnancy in first trimester 12/24/2019 by Tresea Mall, CNM No   Overview Addendum 06/15/2020  9:46 AM by Nadara Mustard, MD    [facility] Prenatal Labs  Dating LMP=11wk [place] Blood type: O/Positive/-- (04/13 0941)   Genetic Screen NIPS:bml XY Antibody:Negative (04/13 0941)  Anatomic [place] [facility], nml Rubella: 2.30 (04/13 0941)  Varicella: Immune  GTT 28 wk: 128 RPR: Non Reactive (04/13 0941)   Rhogam n/a HBsAg: Negative (04/13 0941)   Vaccines TDAP:  05/18/20                   Flu Shot:declines HIV: Non Reactive (04/13 0941)   Baby Food Breast                               GBS:   Contraception POP Pap: 08/05/19 NIL  CBB  no   CS/VBAC NA   Support Person Freida Busman          Previous Version       Gestational age appropriate obstetric precautions including but not limited to vaginal bleeding, contractions, leaking of fluid and fetal movement were reviewed in detail with the patient.    Return in about 1 week (around 07/06/2020) for ROB.  Vena Austria, MD, [facility], [facility] 06/29/2020, 10:24 AM

## 2020-07-01 LAB — STREP GP B NAA: Strep Gp B NAA: NEGATIVE

## 2020-07-06 ENCOUNTER — Other Ambulatory Visit: Payer: Self-pay

## 2020-07-06 ENCOUNTER — Encounter: Payer: Self-pay | Admitting: Obstetrics and Gynecology

## 2020-07-06 ENCOUNTER — Ambulatory Visit (INDEPENDENT_AMBULATORY_CARE_PROVIDER_SITE_OTHER): Payer: 59 | Admitting: Obstetrics and Gynecology

## 2020-07-06 VITALS — BP 100/70 | Wt 177.0 lb

## 2020-07-06 DIAGNOSIS — Z3A36 36 weeks gestation of pregnancy: Secondary | ICD-10-CM

## 2020-07-06 DIAGNOSIS — Z3403 Encounter for supervision of normal first pregnancy, third trimester: Secondary | ICD-10-CM

## 2020-07-06 DIAGNOSIS — B009 Herpesviral infection, unspecified: Secondary | ICD-10-CM

## 2020-07-06 NOTE — Progress Notes (Signed)
  Routine Prenatal Care Visit  Subjective  Sandra Arellano is a 29 y.o. G1P0000 at [redacted]w[redacted]d being seen today for ongoing prenatal care.  She is currently monitored for the following issues for this low-risk pregnancy and has Herpes and Encounter for supervision of normal first pregnancy in first trimester on their problem list.  ----------------------------------------------------------------------------------- Patient reports no complaints.   Contractions: Irritability. Vag. Bleeding: None.  Movement: Present. Leaking Fluid denies.  ----------------------------------------------------------------------------------- The following portions of the patient's history were reviewed and updated as appropriate: allergies, current medications, past family history, past medical history, past social history, past surgical history and problem list. Problem list updated.  Objective  Blood pressure 100/70, weight 177 lb (80.3 kg), last menstrual period 10/22/2019. Pregravid weight 133 lb (60.3 kg) Total Weight Gain 44 lb (20 kg) Urinalysis: Urine Protein    Urine Glucose    Fetal Status: Fetal Heart Rate (bpm): 145 Fundal Height: 37 cm Movement: Present  Presentation: Vertex  General:  Alert, oriented and cooperative. Patient is in no acute distress.  Skin: Skin is warm and dry. No rash noted.   Cardiovascular: Normal heart rate noted  Respiratory: Normal respiratory effort, no problems with respiration noted  Abdomen: Soft, gravid, appropriate for gestational age. Pain/Pressure: Absent     Pelvic:  Cervical exam deferred        Extremities: Normal range of motion.     Mental Status: Normal mood and affect. Normal behavior. Normal judgment and thought content.   BSUS: cephalic, fluid subjectively normal.  Assessment   29 y.o. G1P0000 at [redacted]w[redacted]d by  07/28/2020, by Last Menstrual Period presenting for routine prenatal visit  Plan   Pregnancy #1 Problems (from 12/23/19 to present)    Problem Noted  Resolved   Encounter for supervision of normal first pregnancy in first trimester 12/24/2019 by Tresea Mall, CNM No   Overview Addendum 06/15/2020  9:46 AM by Nadara Mustard, MD    [facility] Prenatal Labs  Dating LMP=11wk [place] Blood type: O/Positive/-- (04/13 0941)   Genetic Screen NIPS:bml XY Antibody:Negative (04/13 0941)  Anatomic [place] [facility], nml Rubella: 2.30 (04/13 0941)  Varicella: Immune  GTT 28 wk: 128 RPR: Non Reactive (04/13 0941)   Rhogam n/a HBsAg: Negative (04/13 0941)   Vaccines TDAP:  05/18/20                   Flu Shot:declines HIV: Non Reactive (04/13 0941)   Baby Food Breast                               GBS:   Contraception POP Pap: 08/05/19 NIL  CBB  no   CS/VBAC NA   Support Person Freida Busman          Previous Version       Preterm labor symptoms and general obstetric precautions including but not limited to vaginal bleeding, contractions, leaking of fluid and fetal movement were reviewed in detail with the patient. Please refer to After Visit Summary for other counseling recommendations.   - has not started valtrex for suppression. DIscussed importance of taking twice daily for suppression due to potential fetal risks and risk of c-section.  SHe voiced understanding and stated that she did not need refills at this time.   Return in about 1 week (around 07/13/2020) for Routine Prenatal Appointment.   Thomasene Mohair, MD, [facility], [facility] 07/06/2020 9:55 AM

## 2020-07-13 ENCOUNTER — Ambulatory Visit (INDEPENDENT_AMBULATORY_CARE_PROVIDER_SITE_OTHER): Payer: 59 | Admitting: Obstetrics and Gynecology

## 2020-07-13 ENCOUNTER — Other Ambulatory Visit: Payer: Self-pay

## 2020-07-13 ENCOUNTER — Encounter: Payer: Self-pay | Admitting: Obstetrics and Gynecology

## 2020-07-13 VITALS — BP 116/80 | Wt 180.0 lb

## 2020-07-13 DIAGNOSIS — Z3401 Encounter for supervision of normal first pregnancy, first trimester: Secondary | ICD-10-CM

## 2020-07-13 DIAGNOSIS — B009 Herpesviral infection, unspecified: Secondary | ICD-10-CM

## 2020-07-13 DIAGNOSIS — Z3A37 37 weeks gestation of pregnancy: Secondary | ICD-10-CM

## 2020-07-13 LAB — POCT URINALYSIS DIPSTICK OB
Glucose, UA: NEGATIVE
POC,PROTEIN,UA: NEGATIVE

## 2020-07-13 MED ORDER — VALACYCLOVIR HCL 500 MG PO TABS: 500.0000 mg | ORAL_TABLET | Freq: Two times a day (BID) | ORAL | 1 refills | Status: DC

## 2020-07-13 NOTE — Progress Notes (Signed)
  Routine Prenatal Care Visit  Subjective  Sandra Arellano is a 29 y.o. G1P0000 at [redacted]w[redacted]d being seen today for ongoing prenatal care.  She is currently monitored for the following issues for this low-risk pregnancy and has Herpes and Encounter for supervision of normal first pregnancy in first trimester on their problem list.  ----------------------------------------------------------------------------------- Patient reports no complaints.   Contractions: Irregular. Vag. Bleeding: None.  Movement: Present. Leaking Fluid denies.  ----------------------------------------------------------------------------------- The following portions of the patient's history were reviewed and updated as appropriate: allergies, current medications, past family history, past medical history, past social history, past surgical history and problem list. Problem list updated.  Objective  Blood pressure 116/80, weight 180 lb (81.6 kg), last menstrual period 10/22/2019. Pregravid weight 133 lb (60.3 kg) Total Weight Gain 47 lb (21.3 kg) Urinalysis: Urine Protein Negative  Urine Glucose Negative  Fetal Status: Fetal Heart Rate (bpm): 145 Fundal Height: 38 cm Movement: Present  Presentation: Vertex  General:  Alert, oriented and cooperative. Patient is in no acute distress.  Skin: Skin is warm and dry. No rash noted.   Cardiovascular: Normal heart rate noted  Respiratory: Normal respiratory effort, no problems with respiration noted  Abdomen: Soft, gravid, appropriate for gestational age. Pain/Pressure: Absent     Pelvic:  Cervical exam performed Dilation: 1 Effacement (%): 70 Station: -2  Extremities: Normal range of motion.     Mental Status: Normal mood and affect. Normal behavior. Normal judgment and thought content.   Assessment   29 y.o. G1P0000 at [redacted]w[redacted]d by  07/28/2020, by Last Menstrual Period presenting for routine prenatal visit  Plan   Pregnancy #1 Problems (from 12/23/19 to present)    Problem Noted  Resolved   Encounter for supervision of normal first pregnancy in first trimester 12/24/2019 by Tresea Mall, CNM No   Overview Addendum 06/15/2020  9:46 AM by Nadara Mustard, MD    [facility] P[facility]atal Labs  Dating LMP=11wk [place] Blood type: O/Positive/-- (04/13 0941)   Genetic Screen NIPS:bml XY Antibody:Negative (04/13 0941)  Anatomic [place] [facility], nml Rubella: 2.30 (04/13 0941)  Varicella: Immune  GTT 28 wk: 128 RPR: Non Reactive (04/13 0941)   Rhogam n/a HBsAg: Negative (04/13 0941)   Vaccines TDAP:  05/18/20                   Flu Shot:declines HIV: Non Reactive (04/13 0941)   Baby Food Breast                               GBS:   Contraception POP Pap: 08/05/19 NIL  CBB  no   CS/VBAC NA   Support Person Freida Busman          Previous Version       Term labor symptoms and general obstetric precautions including but not limited to vaginal bleeding, contractions, leaking of fluid and fetal movement were reviewed in detail with the patient. Please refer to After Visit Summary for other counseling recommendations.   Return in about 1 week (around 07/20/2020) for Routine Prenatal Appointment.   Thomasene Mohair, MD, [facility], [facility] 07/13/2020 12:12 PM

## 2020-07-21 ENCOUNTER — Other Ambulatory Visit: Payer: Self-pay

## 2020-07-21 ENCOUNTER — Encounter: Payer: Self-pay | Admitting: Obstetrics and Gynecology

## 2020-07-21 ENCOUNTER — Ambulatory Visit (INDEPENDENT_AMBULATORY_CARE_PROVIDER_SITE_OTHER): Payer: 59 | Admitting: Obstetrics and Gynecology

## 2020-07-21 VITALS — BP 120/70 | Ht 62.0 in | Wt 181.0 lb

## 2020-07-21 DIAGNOSIS — Z3A39 39 weeks gestation of pregnancy: Secondary | ICD-10-CM

## 2020-07-21 DIAGNOSIS — Z3401 Encounter for supervision of normal first pregnancy, first trimester: Secondary | ICD-10-CM

## 2020-07-21 NOTE — Progress Notes (Signed)
Pt here for ROB. Pt would like to be checked.

## 2020-07-21 NOTE — Progress Notes (Signed)
    Routine Prenatal Care Visit  Subjective  Sandra Arellano is a 29 y.o. G1P0000 at [redacted]w[redacted]d being seen today for ongoing prenatal care.  She is currently monitored for the following issues for this low-risk pregnancy and has Herpes and Encounter for supervision of normal first pregnancy in first trimester on their problem list.  ----------------------------------------------------------------------------------- Patient reports no complaints.   Contractions: Irregular. Vag. Bleeding: None.  Movement: Present. Denies leaking of fluid.  ----------------------------------------------------------------------------------- The following portions of the patient's history were reviewed and updated as appropriate: allergies, current medications, past family history, past medical history, past social history, past surgical history and problem list. Problem list updated.   Objective  Blood pressure 120/70, height 5\' 2"  (1.575 m), weight 181 lb (82.1 kg), last menstrual period 10/22/2019. Pregravid weight 133 lb (60.3 kg) Total Weight Gain 48 lb (21.8 kg) Urinalysis:      Fetal Status: Fetal Heart Rate (bpm): 146 Fundal Height: 39 cm Movement: Present  Presentation: Vertex  General:  Alert, oriented and cooperative. Patient is in no acute distress.  Skin: Skin is warm and dry. No rash noted.   Cardiovascular: Normal heart rate noted  Respiratory: Normal respiratory effort, no problems with respiration noted  Abdomen: Soft, gravid, appropriate for gestational age. Pain/Pressure: Absent     Pelvic:  Cervical exam performed Dilation: 1 Effacement (%): 80 Station: -2  Extremities: Normal range of motion.  Edema: None  Mental Status: Normal mood and affect. Normal behavior. Normal judgment and thought content.     Assessment   29 y.o. G1P0000 at [redacted]w[redacted]d by  07/28/2020, by Last Menstrual Period presenting for routine prenatal visit  Plan   Pregnancy #1 Problems (from 12/23/19 to present)    Problem Noted  Resolved   Encounter for supervision of normal first pregnancy in first trimester 12/24/2019 by 12/26/2019, CNM No   Overview Addendum 07/21/2020 11:23 AM by 13/06/2020, MD    [facility] Prenatal Labs  Dating LMP=11wk Natale Milch Blood type: O/Positive/-- (04/13 0941)   Genetic Screen NIPS:bml XY Antibody:Negative (04/13 0941)  Anatomic 12-19-1980 [facility], nml Rubella: 2.30 (04/13 0941)  Varicella: Immune  GTT 28 wk: 128 RPR: Non Reactive (04/13 0941)   Rhogam n/a HBsAg: Negative (04/13 0941)   Vaccines TDAP:  05/18/20                   Flu Shot:declines HIV: Non Reactive (04/13 0941)   Baby Food Breast                               GBS:  negative  Contraception POP Pap: 08/05/19 NIL  CBB  no   CS/VBAC NA   Support Person 08/07/19          Previous Version      Membranes swept at maternal request  Gestational age appropriate obstetric precautions including but not limited to vaginal bleeding, contractions, leaking of fluid and fetal movement were reviewed in detail with the patient.    Return in about 1 week (around 07/28/2020) for annual.  07/30/2020 MD [facility], [facility] 07/21/2020, 11:39 AM

## 2020-07-21 NOTE — Patient Instructions (Signed)
Pain Relief During Labor and Delivery Many things can cause pain during labor and delivery, including:  Pressure on bones and ligaments due to the baby moving through the pelvis.  Stretching of tissues due to the baby moving through the birth canal.  Muscle tension due to anxiety or nervousness.  The uterus tightening (contracting) and relaxing to help move the baby. There are many ways to deal with the pain of labor and delivery. They include:  Taking prenatal classes. Taking these classes helps you know what to expect during your baby's birth. What you learn will increase your confidence and decrease your anxiety.  Practicing relaxation techniques or doing relaxing activities, such as: ? Focused breathing. ? Meditation. ? Visualization. ? Aroma therapy. ? Listening to your favorite music. ? Hypnosis.  Taking a warm shower or bath (hydrotherapy). This may: ? Provide comfort and relaxation. ? Lessen your perception of pain. ? Decrease the amount of pain medicine needed. ? Decrease the length of labor.  Getting a massage or counterpressure on your back.  Applying warm packs or ice packs.  Changing positions often, moving around, or using a birthing ball.  Getting: ? Pain medicine through an IV or injection into a muscle. ? Pain medicine inserted into your spinal column. ? Injections of sterile water just under the skin on your lower back (intradermal injections). ? Laughing gas (nitrous oxide). Discuss your pain control options with your health care provider during your prenatal visits. Explore the options offered by your hospital or birth center. What kinds of medicine are available? There are two kinds of medicines that can be used to relieve pain during labor and delivery:  Analgesics. These medicines decrease pain without causing you to lose feeling or the ability to move your muscles.  Anesthetics. These medicines block feeling in the body and can decrease your  ability to move freely. Both of these kinds of medicine can cause minor side effects, such as nausea, trouble concentrating, and sleepiness. They can also decrease the baby's heart rate before birth and affect the baby's breathing rate after birth. For this reason, health care providers are careful about when and how much medicine is given. What are specific medicines and procedures that provide pain relief? Local Anesthetics Local anesthetics are used to numb a small area of the body. They may be used along with another kind of anesthetic or used to numb the nerves of the vagina, cervix, and perineum during the second stage of labor. General Anesthetics General anesthetics cause you to lose consciousness so you do not feel pain. They are usually only used for an emergency cesarean delivery. General anesthetics are given through an IV tube and a mask. Pudendal Block A pudendal block is a form of local anesthetic. It may be used to relieve the pain associated with pushing or stretching of the perineum at the time of delivery or to further numb the perineum. A pudendal block is done by injecting numbing medicine through the vaginal wall into a nerve in the pelvis. Epidural Analgesia Epidural analgesia is given through a flexible IV catheter that is inserted into the lower back. Numbing medicine is delivered continuously to the area near your spinal column nerves (epidural space). After having this type of analgesia, you may be able to move your legs but you most likely will not be able to walk. Depending on the amount of medicine given, you may lose all feeling in the lower half of your body, or you may retain some level   of sensation, including the urge to push. Epidural analgesia can be used to provide pain relief for a vaginal birth. Spinal Block A spinal block is similar to epidural analgesia, but the medicine is injected into the spinal fluid instead of the epidural space. A spinal block is only given  once. It starts to relieve pain quickly, but the pain relief lasts only 1-6 hours. Spinal blocks can be used for cesarean deliveries. Combined Spinal-Epidural (CSE) Block A CSE block combines the effects of a spinal block and epidural analgesia. The spinal block works quickly to block all pain. The epidural analgesia provides continuous pain relief, even after the effects of the spinal block have worn off. This information is not intended to replace advice given to you by your health care provider. Make sure you discuss any questions you have with your health care provider. Document Revised: 08/10/2017 Document Reviewed: 01/19/2016 Elsevier Patient Education  2020 [employer] Vaginal Delivery  Vaginal delivery means that you give birth by pushing your baby out of your birth canal (vagina). A team of health care providers will help you before, during, and after vaginal delivery. Birth experiences are unique for every woman and every pregnancy, and birth experiences vary depending on where you choose to give birth. What happens when I arrive at the birth center or hospital? Once you are in labor and have been admitted into the hospital or birth center, your health care provider may:  Review your pregnancy history and any concerns that you have.  Insert an IV into one of your veins. This may be used to give you fluids and medicines.  Check your blood pressure, pulse, temperature, and heart rate (vital signs).  Check whether your bag of water (amniotic sac) has broken (ruptured).  Talk with you about your birth plan and discuss pain control options. Monitoring Your health care provider may monitor your contractions (uterine monitoring) and your baby's heart rate (fetal monitoring). You may need to be monitored:  Often, but not continuously (intermittently).  All the time or for long periods at a time (continuously). Continuous monitoring may be needed if: ? You are taking certain medicines,  such as medicine to relieve pain or make your contractions stronger. ? You have pregnancy or labor complications. Monitoring may be done by:  Placing a special stethoscope or a handheld monitoring device on your abdomen to check your baby's heartbeat and to check for contractions.  Placing monitors on your abdomen (external monitors) to record your baby's heartbeat and the frequency and length of contractions.  Placing monitors inside your uterus through your vagina (internal monitors) to record your baby's heartbeat and the frequency, length, and strength of your contractions. Depending on the type of monitor, it may remain in your uterus or on your baby's head until birth.  Telemetry. This is a type of continuous monitoring that can be done with external or internal monitors. Instead of having to stay in bed, you are able to move around during telemetry. Physical exam Your health care provider may perform frequent physical exams. This may include:  Checking how and where your baby is positioned in your uterus.  Checking your cervix to determine: ? Whether it is thinning out (effacing). ? Whether it is opening up (dilating). What happens during labor and delivery?  Normal labor and delivery is divided into the following three stages: Stage 1  This is the longest stage of labor.  This stage can last for hours or days.  Throughout this stage,   you will feel contractions. Contractions generally feel mild, infrequent, and irregular at first. They get stronger, more frequent (about every 2-3 minutes), and more regular as you move through this stage.  This stage ends when your cervix is completely dilated to 4 inches (10 cm) and completely effaced. Stage 2  This stage starts once your cervix is completely effaced and dilated and lasts until the delivery of your baby.  This stage may last from 20 minutes to 2 hours.  This is the stage where you will feel an urge to push your baby out of  your vagina.  You may feel stretching and burning pain, especially when the widest part of your baby's head passes through the vaginal opening (crowning).  Once your baby is delivered, the umbilical cord will be clamped and cut. This usually occurs after waiting a period of 1-2 minutes after delivery.  Your baby will be placed on your bare chest (skin-to-skin contact) in an upright position and covered with a warm blanket. Watch your baby for feeding cues, like rooting or sucking, and help the baby to your breast for his or her first feeding. Stage 3  This stage starts immediately after the birth of your baby and ends after you deliver the placenta.  This stage may take anywhere from 5 to 30 minutes.  After your baby has been delivered, you will feel contractions as your body expels the placenta and your uterus contracts to control bleeding. What can I expect after labor and delivery?  After labor is over, you and your baby will be monitored closely until you are ready to go home to ensure that you are both healthy. Your health care team will teach you how to care for yourself and your baby.  You and your baby will stay in the same room (rooming in) during your hospital stay. This will encourage early bonding and successful breastfeeding.  You may continue to receive fluids and medicines through an IV.  Your uterus will be checked and massaged regularly (fundal massage).  You will have some soreness and pain in your abdomen, vagina, and the area of skin between your vaginal opening and your anus (perineum).  If an incision was made near your vagina (episiotomy) or if you had some vaginal tearing during delivery, cold compresses may be placed on your episiotomy or your tear. This helps to reduce pain and swelling.  You may be given a squirt bottle to use instead of wiping when you go to the bathroom. To use the squirt bottle, follow these steps: ? Before you urinate, fill the squirt  bottle with warm water. Do not use hot water. ? After you urinate, while you are sitting on the toilet, use the squirt bottle to rinse the area around your urethra and vaginal opening. This rinses away any urine and blood. ? Fill the squirt bottle with clean water every time you use the bathroom.  It is normal to have vaginal bleeding after delivery. Wear a sanitary pad for vaginal bleeding and discharge. Summary  Vaginal delivery means that you will give birth by pushing your baby out of your birth canal (vagina).  Your health care provider may monitor your contractions (uterine monitoring) and your baby's heart rate (fetal monitoring).  Your health care provider may perform a physical exam.  Normal labor and delivery is divided into three stages.  After labor is over, you and your baby will be monitored closely until you are ready to go home. This information   is not intended to replace advice given to you by your health care provider. Make sure you discuss any questions you have with your health care provider. Document Revised: 10/02/2017 Document Reviewed: 10/02/2017 Elsevier Patient Education  2020 [employer] Inc[employer]  

## 2020-07-26 ENCOUNTER — Ambulatory Visit (INDEPENDENT_AMBULATORY_CARE_PROVIDER_SITE_OTHER): Payer: 59 | Admitting: Obstetrics and Gynecology

## 2020-07-26 ENCOUNTER — Other Ambulatory Visit: Payer: Self-pay

## 2020-07-26 VITALS — BP 122/68 | Ht 62.0 in | Wt 186.0 lb

## 2020-07-26 DIAGNOSIS — Z3401 Encounter for supervision of normal first pregnancy, first trimester: Secondary | ICD-10-CM

## 2020-07-26 DIAGNOSIS — B009 Herpesviral infection, unspecified: Secondary | ICD-10-CM

## 2020-07-26 DIAGNOSIS — Z3A39 39 weeks gestation of pregnancy: Secondary | ICD-10-CM

## 2020-07-26 LAB — POCT URINALYSIS DIPSTICK OB
Glucose, UA: NEGATIVE
POC,PROTEIN,UA: NEGATIVE

## 2020-07-26 NOTE — Progress Notes (Signed)
Obstetric H&P   Chief Complaint: iol  Prenatal Care Provider: [facility]  History of Present Illness: 29 y.o. G1P0000 [redacted]w[redacted]d by 07/28/2020, by Last Menstrual Period presenting for ROB and IOL scheduling.  +FM, no LOF, no VB, irregular contractions.  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  Pregravid weight 133 lb (60.3 kg) Total Weight Gain 53 lb (24 kg)  Pregnancy #1 Problems (from 12/23/19 to present)    Problem Noted Resolved   Encounter for supervision of normal first pregnancy in first trimester 12/24/2019 by Tresea Mall, CNM No   Overview Addendum 07/21/2020 11:23 AM by Natale Milch, MD    [facility] Prenatal Labs  Dating LMP=11wk [place] Blood type: O/Positive/-- (04/13 0941)   Genetic Screen NIPS:bml XY Antibody:Negative (04/13 0941)  Anatomic [place] [facility], nml Rubella: 2.30 (04/13 0941)  Varicella: Immune  GTT 28 wk: 128 RPR: Non Reactive (04/13 0941)   Rhogam n/a HBsAg: Negative (04/13 0941)   Vaccines TDAP:  05/18/20                   Flu Shot:declines HIV: Non Reactive (04/13 0941)   Baby Food Breast                               GBS:  negative  Contraception POP Pap: 08/05/19 NIL  CBB  no   CS/VBAC NA   Support Person Freida Busman          Previous Version       Review of Systems: 10 point review of systems negative unless otherwise noted in HPI  Past Medical History: Patient Active Problem List   Diagnosis Date Noted  . Encounter for supervision of normal first pregnancy in first trimester 12/24/2019    [facility] Prenatal Labs  Dating LMP=11wk [place] Blood type: O/Positive/-- (04/13 0941)   Genetic Screen NIPS:bml XY Antibody:Negative (04/13 0941)  Anatomic [place] [facility], nml Rubella: 2.30 (04/13 0941)  Varicella: Immune  GTT 28 wk: 128 RPR: Non Reactive (04/13 0941)   Rhogam n/a HBsAg: Negative (04/13 0941)   Vaccines TDAP:  05/18/20                   Flu Shot:declines HIV: Non Reactive (04/13 0941)   Baby Food Breast                                GBS:  negative  Contraception POP Pap: 08/05/19 NIL  CBB  no   CS/VBAC NA   Support Person Freida Busman       . Herpes 06/06/2019    Acyclovir     Past Surgical History: Past Surgical History:  Procedure Laterality Date  . NO PAST SURGERIES      Past Obstetric History: # 1 - Date: None, Sex: None, Weight: None, GA: None, Delivery: None, Apgar1: None, Apgar5: None, Living: None, Birth Comments: None   Past Gynecologic History:  Family History: Family History  Problem Relation Age of Onset  . Diabetes Maternal Grandmother   . Heart disease Maternal Grandmother   . Diabetes Maternal Grandfather   . COPD Maternal Grandfather   . Breast cancer Paternal Grandmother   . Diabetes Paternal Grandmother   . Diabetes Paternal Grandfather   . Kidney disease Paternal Grandfather   . Hypertension Father   . Ovarian cancer Neg Hx   . Colon cancer Neg Hx  Social History: Social History   Socioeconomic History  . Marital status: Single    Spouse name: Not on file  . Number of children: Not on file  . Years of education: Not on file  . Highest education level: Not on file  Occupational History  . Not on file  Tobacco Use  . Smoking status: Never Smoker  . Smokeless tobacco: Never Used  Vaping Use  . Vaping Use: Never used  Substance and Sexual Activity  . Alcohol use: Yes    Comment: occas  . Drug use: No  . Sexual activity: Yes    Comment: mirena inserted 07/30/2017  Other Topics Concern  . Not on file  Social History Narrative  . Not on file   Social Determinants of Health   Financial Resource Strain:   . Difficulty of Paying Living Expenses: Not on file  Food Insecurity:   . Worried About Programme researcher, broadcasting/film/video in the Last Year: Not on file  . Ran Out of Food in the Last Year: Not on file  Transportation Needs:   . Lack of Transportation (Medical): Not on file  . Lack of Transportation (Non-Medical): Not on file  Physical Activity:   . Days of Exercise per  Week: Not on file  . Minutes of Exercise per Session: Not on file  Stress:   . Feeling of Stress : Not on file  Social Connections:   . Frequency of Communication with Friends and Family: Not on file  . Frequency of Social Gatherings with Friends and Family: Not on file  . Attends Religious Services: Not on file  . Active Member of Clubs or Organizations: Not on file  . Attends Banker Meetings: Not on file  . Marital Status: Not on file  Intimate Partner Violence:   . Fear of Current or Ex-Partner: Not on file  . Emotionally Abused: Not on file  . Physically Abused: Not on file  . Sexually Abused: Not on file    Medications: Prior to Admission medications   Medication Sig Start Date End Date Taking? Authorizing Provider  Biotin 1 MG CAPS Take by mouth.   Yes [provider]  BLACK ELDERBERRY,BERRY-FLOWER, PO Take by mouth.   Yes [provider]  Prenatal MV & Min w/FA-DHA ([employer] PRENATAL GUMMY PO) Take by mouth.   Yes [provider]  valACYclovir (VALTREX) 500 MG tablet Take 1 tablet (500 mg total) by mouth 2 (two) times daily. 07/13/20  Yes Conard Novak, MD    Allergies: No Known Allergies  Physical Exam: Vitals: Blood pressure 122/68, height 5\' 2"  (1.575 m), weight 186 lb (84.4 kg), last menstrual period 10/22/2019.  Urine Dip Protein: N/A  FHT: 135  General: NAD HEENT: normocephalic, anicteric Pulmonary: No increased work of breathing Cardiovascular: RRR, distal pulses 2+ Abdomen: Gravid, non-tender Leopolds: vtx Genitourinary: 1/80/-2 Extremities: no edema, erythema, or tenderness Neurologic: Grossly intact Psychiatric: mood appropriate, affect full  Labs: Results for orders placed or performed in visit on 07/26/20 (from the past 24 hour(s))  POC Urinalysis Dipstick OB     Status: None   Collection Time: 07/26/20  8:42 AM  Result Value Ref Range   Color, UA     Clarity, UA     Glucose, UA Negative Negative    Bilirubin, UA     Ketones, UA     Spec Grav, UA     Blood, UA     pH, UA     POC,PROTEIN,UA Negative  Negative, Trace, Small (1+), Moderate (2+), Large (3+), 4+   Urobilinogen, UA     Nitrite, UA     Leukocytes, UA     Appearance     Odor      Assessment: 29 y.o. G1P0000 [redacted]w[redacted]d by 07/28/2020, by Last Menstrual Period presenting for ROB and IOL scheduling  Plan: 1) IOL scheduled for 08/01/2020  2) Fetus - +FHT  3) PNL - Blood type O/Positive/-- (04/13 0941) / Anti-bodyscreen Negative (04/13 0941) / Rubella 2.30 (04/13 0941) / Varicella Immune / RPR Non Reactive (08/24 0926) / HBsAg Negative (04/13 0941) / HIV Non Reactive (08/24 0926) / / GBS Negative/-- (10/19 1226)  4) Immunization History -  Immunization History  Administered Date(s) Administered  . Influenza,inj,quad, With Preservative 06/12/2018  . Tdap 05/18/2020    5) Disposition - pending delivery  Vena Austria, MD, [facility], [facility] 07/26/2020, 9:14 AM

## 2020-07-26 NOTE — Progress Notes (Signed)
  [facility] BIRTHPLACE INDUCTION ASSESSMENT SCHEDULING [employer] 11-06-1990 Medical record #: [id number] Phone #:  Home Phone [phone]  Mobile [phone]    [facility]atal Provider:[facility] Proposed admission date/time:08/01/2020 8 aM Method of induction:Cytotec  Weight: Filed Weights11/15/21 0834Weight:186 lb (84.4 kg) BMI Body mass index is 34.02 kg/m. HIV Negative HSV Positive EDC Estimated Date of Delivery: 11/17/21based on:LMP  Gestational age on admission: [redacted]W[redacted]D Gravidity/parity:G1P0000  Cervix Score   0 1 2 3   Position Posterior Midposition Anterior   Consistency Firm Medium Soft   Effacement (%) 0-30 40-50 60-70 >80  Dilation (cm) Closed 1-2 3-4 >5  Baby's station -3 -2 -1 +1, +2   Bishop Score:8   Medical induction of labor  select indication(s) below Elective induction ?39 weeks multiparous patient ?39 weeks primiparous patient with Bishop score ?7 ?40 weeks primiparous patient   Medical Indications Adapted from ACOG Committee Opinion #[id number], "Medically Indicated Late Preterm and Early Term Deliveries," 2013.  PLACENTAL / UTERINE ISSUES FETAL ISSUES MATERNAL ISSUES  ? Placenta previa (36.0-37.6) ? Isoimmunization (37.0-38.6) ? Preeclampsia without severe features or gestational HTN (37.0)  ? Suspected accreta (34.0-35.6) ? Growth Restriction 11-02-1982) ? Preeclampsia with severe features (34.0)  ? Prior classical CD, uterine window, rupture (36.0-37.6) ? Isolated (38.0-39.6) ? Chronic HTN (38.0-39.6)  ? Prior myomectomy (37.0-38.6) ? Concurrent findings (34.0-37.6) ? Cholestasis (37.0)  ? Umbilical vein varix (37.0) ? Growth Restriction (Twins) ? Diabetes  ? Placental abruption (chronic) ? Di-Di Isolated (36.0-37.6) ? Pregestational, controlled (39.0)  OBSTETRIC ISSUES ? Di-Di concurrent findings (32.0-34.6) ? Pregestational, uncontrolled (37.0-39.0)  X  Postdates ? (41 weeks) ? Mo-Di isolated (32.0-34.6) ? Pregestational,  vascular compromise (37.0- 39.0)  ? PPROM (34.0) ? Multiple Gestation ? Gestational, diet controlled (40.0)  ? Hx of IUFD (39.0 weeks) ? Di-Di (38.0-38.6) ? Gestational, med controlled (39.0)  ? Polyhydramnios, mild/moderate; SDV 8-16 or AFI 25-35 (39.0) ? Mo-Di (36.0-37.6) ? Gestational, uncontrolled (38.0-39.0)  ? Oligohydramnios (36.0-37.6); MVP <2 cm  For indications not listed above, delivery recommendations from maternal-fetal medicine consultant occurred on: Date:  Provider Signature: Christianjames Soule Scheduled by: Date:07/26/2020 9:12 AM   Call [phone] to finalize the induction date/time  [phone] (07/17)

## 2020-07-26 NOTE — Progress Notes (Signed)
ROB No concerns 

## 2020-07-26 NOTE — Progress Notes (Signed)
    Routine Prenatal Care Visit  Subjective  Sandra Arellano is a 29 y.o. G1P0000 at [redacted]w[redacted]d being seen today for ongoing prenatal care.  She is currently monitored for the following issues for this low-risk pregnancy and has Herpes and Encounter for supervision of normal first pregnancy in first trimester on their problem list.  ----------------------------------------------------------------------------------- Patient reports no complaints.   Contractions: Irregular. Vag. Bleeding: None.  Movement: Present. Denies leaking of fluid.  ----------------------------------------------------------------------------------- The following portions of the patient's history were reviewed and updated as appropriate: allergies, current medications, past family history, past medical history, past social history, past surgical history and problem list. Problem list updated.   Objective  Blood pressure 122/68, height 5\' 2"  (1.575 m), weight 186 lb (84.4 kg), last menstrual period 10/22/2019. Pregravid weight 133 lb (60.3 kg) Total Weight Gain 53 lb (24 kg) Urinalysis:      Fetal Status: Fetal Heart Rate (bpm): 135   Movement: Present  Presentation: Vertex  General:  Alert, oriented and cooperative. Patient is in no acute distress.  Skin: Skin is warm and dry. No rash noted.   Cardiovascular: Normal heart rate noted  Respiratory: Normal respiratory effort, no problems with respiration noted  Abdomen: Soft, gravid, appropriate for gestational age. Pain/Pressure: Absent     Pelvic:  Cervical exam performed Dilation: 1 Effacement (%): 80 Station: -2  Extremities: Normal range of motion.     ental Status: Normal mood and affect. Normal behavior. Normal judgment and thought content.     Assessment   29 y.o. G1P0000 at [redacted]w[redacted]d by  07/28/2020, by Last Menstrual Period presenting for routine prenatal visit  Plan   Pregnancy #1 Problems (from 12/23/19 to present)    Problem Noted Resolved   Encounter for  supervision of normal first pregnancy in first trimester 12/24/2019 by 12/26/2019, CNM No   Overview Addendum 07/21/2020 11:23 AM by 13/06/2020, MD    [facility] Prenatal Labs  Dating LMP=11wk Natale Milch Blood type: O/Positive/-- (04/13 0941)   Genetic Screen NIPS:bml XY Antibody:Negative (04/13 0941)  Anatomic 12[birth year]19-1982 [facility], nml Rubella: 2.30 (04/13 0941)  Varicella: Immune  GTT 28 wk: 128 RPR: Non Reactive (04/13 0941)   Rhogam n/a HBsAg: Negative (04/13 0941)   Vaccines TDAP:  05/18/20                   Flu Shot:declines HIV: Non Reactive (04/13 0941)   Baby Food Breast                               GBS:  negative  Contraception POP Pap: 08/05/19 NIL  CBB  no   CS/VBAC NA   Support Person 08/07/19          Previous Version       Gestational age appropriate obstetric precautions including but not limited to vaginal bleeding, contractions, leaking of fluid and fetal movement were reviewed in detail with the patient.    No follow-ups on file.  Freida Busman, MD, Vena Austria [facility], [facility] 07/26/2020, 9:12 AM

## 2020-07-30 ENCOUNTER — Other Ambulatory Visit
Admission: RE | Admit: 2020-07-30 | Discharge: 2020-07-30 | Disposition: A | Discharge status: Home / Self Care | Payer: 59 | Source: Ambulatory Visit | Attending: Obstetrics and Gynecology | Admitting: Obstetrics and Gynecology

## 2020-07-30 ENCOUNTER — Other Ambulatory Visit: Payer: Self-pay

## 2020-07-30 DIAGNOSIS — Z20822 Contact with and (suspected) exposure to covid-19: Secondary | ICD-10-CM | POA: Insufficient documentation

## 2020-07-30 DIAGNOSIS — Z01812 Encounter for preprocedural laboratory examination: Secondary | ICD-10-CM | POA: Insufficient documentation

## 2020-07-30 LAB — SARS CORONAVIRUS 2 (TAT 6-24 HRS): SARS Coronavirus 2: NEGATIVE

## 2020-08-01 ENCOUNTER — Inpatient Hospital Stay: Payer: 59 | Admitting: Anesthesiology

## 2020-08-01 ENCOUNTER — Inpatient Hospital Stay
Admission: EM | Admit: 2020-08-01 | Discharge: 2020-08-03 | DRG: 806 | Disposition: A | Discharge status: Home / Self Care | Payer: 59 | Attending: Obstetrics and Gynecology | Admitting: Obstetrics and Gynecology

## 2020-08-01 ENCOUNTER — Encounter: Payer: Self-pay | Admitting: Obstetrics and Gynecology

## 2020-08-01 ENCOUNTER — Other Ambulatory Visit: Payer: Self-pay

## 2020-08-01 DIAGNOSIS — B009 Herpesviral infection, unspecified: Secondary | ICD-10-CM | POA: Diagnosis present

## 2020-08-01 DIAGNOSIS — O9081 Anemia of the puerperium: Secondary | ICD-10-CM | POA: Diagnosis not present

## 2020-08-01 DIAGNOSIS — O26893 Other specified pregnancy related conditions, third trimester: Secondary | ICD-10-CM | POA: Diagnosis present

## 2020-08-01 DIAGNOSIS — Z3A40 40 weeks gestation of pregnancy: Secondary | ICD-10-CM | POA: Diagnosis not present

## 2020-08-01 DIAGNOSIS — O480 Post-term pregnancy: Secondary | ICD-10-CM | POA: Diagnosis present

## 2020-08-01 DIAGNOSIS — Z20822 Contact with and (suspected) exposure to covid-19: Secondary | ICD-10-CM | POA: Diagnosis present

## 2020-08-01 DIAGNOSIS — O9832 Other infections with a predominantly sexual mode of transmission complicating childbirth: Secondary | ICD-10-CM | POA: Diagnosis present

## 2020-08-01 DIAGNOSIS — R030 Elevated blood-pressure reading, without diagnosis of hypertension: Secondary | ICD-10-CM | POA: Diagnosis present

## 2020-08-01 DIAGNOSIS — Z3490 Encounter for supervision of normal pregnancy, unspecified, unspecified trimester: Secondary | ICD-10-CM

## 2020-08-01 DIAGNOSIS — A6000 Herpesviral infection of urogenital system, unspecified: Secondary | ICD-10-CM | POA: Diagnosis present

## 2020-08-01 DIAGNOSIS — D62 Acute posthemorrhagic anemia: Secondary | ICD-10-CM | POA: Diagnosis not present

## 2020-08-01 DIAGNOSIS — Z3403 Encounter for supervision of normal first pregnancy, third trimester: Secondary | ICD-10-CM

## 2020-08-01 DIAGNOSIS — Z3401 Encounter for supervision of normal first pregnancy, first trimester: Secondary | ICD-10-CM

## 2020-08-01 LAB — PROTEIN / CREATININE RATIO, URINE
Creatinine, Urine: 68 mg/dL
Protein Creatinine Ratio: 0.12 mg/mg{Cre} (ref 0.00–0.15)
Total Protein, Urine: 8 mg/dL

## 2020-08-01 LAB — COMPREHENSIVE METABOLIC PANEL
ALT: 13 U/L (ref 0–44)
AST: 25 U/L (ref 15–41)
Albumin: 2.9 g/dL — ABNORMAL LOW (ref 3.5–5.0)
Alkaline Phosphatase: 142 U/L — ABNORMAL HIGH (ref 38–126)
Anion gap: 11 (ref 5–15)
BUN: 7 mg/dL (ref 6–20)
CO2: 20 mmol/L — ABNORMAL LOW (ref 22–32)
Calcium: 8.8 mg/dL — ABNORMAL LOW (ref 8.9–10.3)
Chloride: 105 mmol/L (ref 98–111)
Creatinine, Ser: 0.59 mg/dL (ref 0.44–1.00)
GFR, Estimated: >60 mL/min (ref >60)
Glucose, Bld: 135 mg/dL — ABNORMAL HIGH (ref 70–99)
Potassium: 3.7 mmol/L (ref 3.5–5.1)
Sodium: 136 mmol/L (ref 135–145)
Total Bilirubin: 0.5 mg/dL (ref 0.3–1.2)
Total Protein: 5.9 g/dL — ABNORMAL LOW (ref 6.5–8.1)

## 2020-08-01 LAB — CBC
HCT: 37.2 % (ref 36.0–46.0)
Hemoglobin: 12.6 g/dL (ref 12.0–15.0)
MCH: 30.6 pg (ref 26.0–34.0)
MCHC: 33.9 g/dL (ref 30.0–36.0)
MCV: 90.3 fL (ref 80.0–100.0)
Platelets: 164 10*3/uL (ref 150–400)
RBC: 4.12 MIL/uL (ref 3.87–5.11)
RDW: 13.3 % (ref 11.5–15.5)
WBC: 11.6 10*3/uL — ABNORMAL HIGH (ref 4.0–10.5)
nRBC: 0 % (ref 0.0–0.2)

## 2020-08-01 LAB — TYPE AND SCREEN
ABO/RH(D): O POS
Antibody Screen: NEGATIVE

## 2020-08-01 LAB — ABO/RH: ABO/RH(D): O POS

## 2020-08-01 MED ORDER — ACETAMINOPHEN 325 MG PO TABS: 650.0000 mg | ORAL_TABLET | ORAL | Status: DC | PRN

## 2020-08-01 MED ORDER — OXYTOCIN-SODIUM CHLORIDE 30-0.9 UT/500ML-% IV SOLN
2.5000 [IU]/h | INTRAVENOUS | Status: DC
  Filled 2020-08-01: qty 500

## 2020-08-01 MED ORDER — FENTANYL 2.5 MCG/ML W/ROPIVACAINE 0.15% IN NS 100 ML EPIDURAL (ARMC)
EPIDURAL | Status: DC | PRN
  Administered 2020-08-01: 12 mL/h via EPIDURAL

## 2020-08-01 MED ORDER — BUTORPHANOL TARTRATE 1 MG/ML IJ SOLN: 1.0000 mg | INTRAMUSCULAR | Status: DC | PRN

## 2020-08-01 MED ORDER — OXYTOCIN-SODIUM CHLORIDE 30-0.9 UT/500ML-% IV SOLN
1.0000 m[IU]/min | INTRAVENOUS | Status: DC
  Administered 2020-08-01: 1 m[IU]/min via INTRAVENOUS

## 2020-08-01 MED ORDER — LACTATED RINGERS IV SOLN: INTRAVENOUS | Status: DC

## 2020-08-01 MED ORDER — ONDANSETRON HCL 4 MG/2ML IJ SOLN
4.0000 mg | Freq: Four times a day (QID) | INTRAMUSCULAR | Status: DC | PRN
  Administered 2020-08-01: 4 mg via INTRAVENOUS
  Filled 2020-08-01: qty 2

## 2020-08-01 MED ORDER — OXYTOCIN BOLUS FROM INFUSION
333.0000 mL | Freq: Once | INTRAVENOUS | Status: AC
  Administered 2020-08-02: 333 mL via INTRAVENOUS

## 2020-08-01 MED ORDER — MISOPROSTOL 25 MCG QUARTER TABLET
25.0000 ug | ORAL_TABLET | ORAL | Status: DC | PRN
  Administered 2020-08-01: 25 ug via VAGINAL
  Filled 2020-08-01 (×2): qty 1

## 2020-08-01 MED ORDER — SODIUM CHLORIDE (PF) 0.9 % IJ SOLN
INTRAMUSCULAR | Status: AC
  Filled 2020-08-01: qty 50

## 2020-08-01 MED ORDER — TERBUTALINE SULFATE 1 MG/ML IJ SOLN: 0.2500 mg | Freq: Once | INTRAMUSCULAR | Status: DC | PRN

## 2020-08-01 MED ORDER — LACTATED RINGERS IV SOLN
500.0000 mL | INTRAVENOUS | Status: DC | PRN
  Administered 2020-08-01: 1000 mL via INTRAVENOUS
  Administered 2020-08-01: 500 mL via INTRAVENOUS

## 2020-08-01 MED ORDER — MISOPROSTOL 200 MCG PO TABS
ORAL_TABLET | ORAL | Status: AC
  Filled 2020-08-01: qty 4

## 2020-08-01 MED ORDER — AMMONIA AROMATIC IN INHA
RESPIRATORY_TRACT | Status: AC
  Filled 2020-08-01: qty 10

## 2020-08-01 MED ORDER — OXYTOCIN 10 UNIT/ML IJ SOLN
INTRAMUSCULAR | Status: AC
  Filled 2020-08-01: qty 2

## 2020-08-01 MED ORDER — LIDOCAINE HCL (PF) 1 % IJ SOLN
30.0000 mL | INTRAMUSCULAR | Status: AC | PRN
  Administered 2020-08-02: 30 mL via SUBCUTANEOUS
  Filled 2020-08-01: qty 30

## 2020-08-01 MED ORDER — FENTANYL 2.5 MCG/ML W/ROPIVACAINE 0.15% IN NS 100 ML EPIDURAL (ARMC)
EPIDURAL | Status: AC
  Filled 2020-08-01: qty 100

## 2020-08-01 MED ORDER — SOD CITRATE-CITRIC ACID 500-334 MG/5ML PO SOLN: 30.0000 mL | ORAL | Status: DC | PRN

## 2020-08-01 MED ORDER — LIDOCAINE-EPINEPHRINE (PF) 1.5 %-1:200000 IJ SOLN
INTRAMUSCULAR | Status: DC | PRN
  Administered 2020-08-01: 3 mL via EPIDURAL

## 2020-08-01 MED ORDER — BUPIVACAINE HCL (PF) 0.25 % IJ SOLN
INTRAMUSCULAR | Status: DC | PRN
  Administered 2020-08-01: 5 mL via EPIDURAL

## 2020-08-01 NOTE — H&P (Signed)
History and Physical Interval Note:  08/01/2020 09:30 AM  Claude Law  has presented today for INDUCTION OF LABOR (cervical ripening agents, foley bulb),  with the diagnosis of Favorable cervix at term and Elective. The various methods of treatment have been discussed with the patient and significant other. After consideration of risks, benefits and other options for treatment, the patient has consented to  Labor induction .  The patient's history has been reviewed, patient examined, no change in status, and is stable for induction as planned.  See H&P. I have reviewed the patient's chart and labs.  Questions were answered to the patient's satisfaction.    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 External: Normal appearing vulva. No lesions noted.  Speculum examination: Normal appearing cervix. No blood in the vaginal vault. No discharge. No lesions noted on internal exam.     Zipporah Plants, CNM [facility], [facility] 08/01/2020  11:04 AM

## 2020-08-01 NOTE — Anesthesia Procedure Notes (Signed)
Epidural Patient location during procedure: OB Start time: 08/01/2020 6:53 PM End time: 08/01/2020 7:25 PM  Staffing Resident/CRNA: Clovis Fredrickson, CRNA Performed: resident/CRNA   Preanesthetic Checklist Completed: patient identified, IV checked, site marked, risks and benefits discussed, surgical consent, monitors and equipment checked, pre-op evaluation and timeout performed  Epidural Patient position: sitting Prep: ChloraPrep Patient monitoring: heart rate, continuous pulse ox and blood pressure Approach: midline Location: L4-L5 Injection technique: LOR saline  Needle:  Needle type: Tuohy  Needle gauge: 18 G Needle length: 9 cm and 9 Needle insertion depth: 6.5 cm Catheter type: closed end flexible Catheter size: 20 Guage Catheter at skin depth: 10.5 cm Test dose: negative and 1.5% lidocaine with Epi 1:200 K  Assessment Sensory level: T10 Events: blood not aspirated, injection not painful, no injection resistance, no paresthesia and negative IV test  Additional Notes   Patient tolerated the insertion well without complications.Reason for block:procedure for pain

## 2020-08-01 NOTE — Progress Notes (Signed)
Labor Progress Note  Sandra Arellano is a 29 y.o. G1P0000 at [redacted]w[redacted]d by ultrasound admitted for induction of labor due to Elective at term.  Subjective: Patient resting comfortably in bed. Report spending the last several hours on the birthing ball - now desiring to rest. Rate pain 5/10- breathing and talking easily through contractions. Denies S&S of preE including HA, dizziness, blurred vision, RUQ pain.  Objective: BP 121/79 (BP Location: Left Arm)   Pulse 95   Temp 98.4 F (36.9 C) (Oral)   Resp 18   Ht 5\' 2"  (1.575 m)   Wt 84.4 kg Comment: 186lbs  LMP 10/22/2019 (Exact Date)   BMI 34.02 kg/m   Fetal Assessment: FHT:  FHR: 155 bpm, variability: moderate,  accelerations:  Present,  decelerations:  Present intermittent variables Category/reactivity:  Category II UC:   irregular, every 2-5 minutes - palpate moderate  SVE:   5/90/-2-3  Membrane status: intact, BBOW  Labs: Lab Results  Component Value Date   WBC 11.6 (H) 08/01/2020   HGB 12.6 08/01/2020   HCT 37.2 08/01/2020   MCV 90.3 08/01/2020   PLT 164 08/01/2020    Assessment / Plan: Induction of labor due to elective,  progressing well on pitocin  Labor: Progressing with irregular contraction pattern - will start low dose pitocin PIH: labs within normal limits - will continue to monitor closely Fetal Wellbeing:  Category II Pain Control:  Labor support without medications - epidural per patient request I/D:  n/a Anticipated MOD:  NSVD  08/03/2020, CNM 08/01/2020, 5:22 PM

## 2020-08-01 NOTE — Progress Notes (Signed)
Labor Progress Note  Sandra Arellano is a 29 y.o. G1P0000 at [redacted]w[redacted]d by ultrasound admitted for induction of labor due to Elective at term.  Subjective: Patient resting comfortably with with epidural placed. Denies pelvic pressure or pain at this time.   Objective: BP 121/87   Pulse 94   Temp 97.9 F (36.6 C) (Oral)   Resp 18   Ht 5\' 2"  (1.575 m)   Wt 84.4 kg Comment: 186lbs  LMP 10/22/2019 (Exact Date)   SpO2 100%   BMI 34.02 kg/m    Fetal Assessment: FHT:  FHR: 130 bpm, variability: moderate,  accelerations:  Present,  decelerations:  Present- early Category/reactivity:  Category I UC:   regular, every 1.5-2 minutes - palpate moderate  SVE:   6/100/-2  Membrane status: SROM Amniotic color: clear fluid   Labs: Lab Results  Component Value Date   WBC 11.6 (H) 08/01/2020   HGB 12.6 08/01/2020   HCT 37.2 08/01/2020   MCV 90.3 08/01/2020   PLT 164 08/01/2020    Assessment / Plan: Elective induction of labor,  progressing well on pitocin, SROM'd   Labor: Progressing on Pitocin, will continue to monitor PIH: Continue to monitor BP   Fetal Wellbeing:  Category I Pain Control:  Epidural I/D:  n/a Anticipated MOD:  NSVD  08/03/2020, CNM 08/01/2020, 9:35 PM

## 2020-08-01 NOTE — Anesthesia Preprocedure Evaluation (Signed)
Anesthesia Evaluation  Patient identified by MRN, date of birth, ID band Patient awake    Airway Mallampati: II       Dental   Pulmonary neg pulmonary ROS,           Cardiovascular negative cardio ROS       Neuro/Psych negative neurological ROS     GI/Hepatic negative GI ROS, Neg liver ROS,   Endo/Other  negative endocrine ROS  Renal/GU negative Renal ROS     Musculoskeletal negative musculoskeletal ROS (+)   Abdominal   Peds  Hematology   Anesthesia Other Findings   Reproductive/Obstetrics (+) Pregnancy                             Anesthesia Physical Anesthesia Plan  ASA: II  Anesthesia Plan: Epidural   Post-op Pain Management:    Induction:   PONV Risk Score and Plan:   Airway Management Planned:   Additional Equipment:   Intra-op Plan:   Post-operative Plan:   Informed Consent: I have reviewed the patients History and Physical, chart, labs and discussed the procedure including the risks, benefits and alternatives for the proposed anesthesia with the patient or authorized representative who has indicated his/her understanding and acceptance.       Plan Discussed with:   Anesthesia Plan Comments:         Anesthesia Quick Evaluation

## 2020-08-01 NOTE — Progress Notes (Addendum)
Labor Progress Note  Sandra Arellano is a 29 y.o. G1P0000 at [redacted]w[redacted]d by ultrasound admitted for induction of labor due to Elective at term.  Subjective: Patient doing well. Ambulating in hallway. Breathing well through contractions, rating current pain with contraction 5/10.  Denies S&S of preE including HA, dizziness, blurred vision, RUQ pain.  Objective: BP 139/88 (BP Location: Right Arm)    Pulse 89    Temp 99.5 F (37.5 C) (Oral)    Resp 16    Ht 5\' 2"  (1.575 m)    Wt 84.4 kg Comment: 186lbs   LMP 10/22/2019 (Exact Date)    BMI 34.02 kg/m   Fetal Assessment: FHT:  FHR: 145 bpm, variability: moderate,  accelerations:  Present,  decelerations:  Absent Category/reactivity:  Category I UC:   irregular, every 1-5 minutes - palpate mild  SVE:   2-3/90/-3 Membrane status: intact  Labs: Lab Results  Component Value Date   WBC 11.6 (H) 08/01/2020   HGB 12.6 08/01/2020   HCT 37.2 08/01/2020   MCV 90.3 08/01/2020   PLT 164 08/01/2020    Assessment / Plan: Elective induction of labor progressing after cytotec administration and foley bulb placement Labor: Progressing - will continue to monitor contractio pattern and foley bulb placement Fetal Wellbeing:  Category I Pain Control:  Labor support without medications  Elevated BP: PIH labs sent - wnl  I/D:  n/a Anticipated MOD:  NSVD  08/03/2020, CNM 08/01/2020, 2:09 PM

## 2020-08-02 ENCOUNTER — Encounter: Payer: Self-pay | Admitting: Obstetrics and Gynecology

## 2020-08-02 DIAGNOSIS — O480 Post-term pregnancy: Principal | ICD-10-CM

## 2020-08-02 DIAGNOSIS — Z3A40 40 weeks gestation of pregnancy: Secondary | ICD-10-CM | POA: Diagnosis not present

## 2020-08-02 LAB — RPR: RPR Ser Ql: NONREACTIVE

## 2020-08-02 MED ORDER — COCONUT OIL OIL
1.0000 "application " | TOPICAL_OIL | Status: DC | PRN
  Administered 2020-08-02: 1 via TOPICAL
  Filled 2020-08-02: qty 120

## 2020-08-02 MED ORDER — SENNOSIDES-DOCUSATE SODIUM 8.6-50 MG PO TABS
2.0000 | ORAL_TABLET | ORAL | Status: DC
  Administered 2020-08-03: 2 via ORAL
  Filled 2020-08-02: qty 2

## 2020-08-02 MED ORDER — ACETAMINOPHEN 325 MG PO TABS
650.0000 mg | ORAL_TABLET | ORAL | Status: DC | PRN
  Administered 2020-08-02 – 2020-08-03 (×3): 650 mg via ORAL
  Filled 2020-08-02 (×3): qty 2

## 2020-08-02 MED ORDER — WITCH HAZEL-GLYCERIN EX PADS
1.0000 "application " | MEDICATED_PAD | CUTANEOUS | Status: DC | PRN
  Administered 2020-08-03: 1 via TOPICAL
  Filled 2020-08-02: qty 100

## 2020-08-02 MED ORDER — BENZOCAINE-MENTHOL 20-0.5 % EX AERO
1.0000 "application " | INHALATION_SPRAY | CUTANEOUS | Status: DC | PRN
  Administered 2020-08-03: 1 via TOPICAL
  Filled 2020-08-02: qty 56

## 2020-08-02 MED ORDER — PRENATAL MULTIVITAMIN CH
1.0000 | ORAL_TABLET | Freq: Every day | ORAL | Status: DC
  Administered 2020-08-02 – 2020-08-03 (×2): 1 via ORAL
  Filled 2020-08-02 (×2): qty 1

## 2020-08-02 MED ORDER — IBUPROFEN 600 MG PO TABS
600.0000 mg | ORAL_TABLET | Freq: Four times a day (QID) | ORAL | Status: DC
  Administered 2020-08-02 – 2020-08-03 (×6): 600 mg via ORAL
  Filled 2020-08-02 (×6): qty 1

## 2020-08-02 MED ORDER — HYDROCODONE-ACETAMINOPHEN 5-325 MG PO TABS: 1.0000 | ORAL_TABLET | Freq: Four times a day (QID) | ORAL | Status: DC | PRN

## 2020-08-02 MED ORDER — FERROUS SULFATE 325 (65 FE) MG PO TABS
325.0000 mg | ORAL_TABLET | Freq: Two times a day (BID) | ORAL | Status: DC
  Administered 2020-08-02 – 2020-08-03 (×3): 325 mg via ORAL
  Filled 2020-08-02 (×3): qty 1

## 2020-08-02 MED ORDER — WITCH HAZEL-GLYCERIN EX PADS
MEDICATED_PAD | CUTANEOUS | Status: AC
  Filled 2020-08-02: qty 100

## 2020-08-02 MED ORDER — DIBUCAINE (PERIANAL) 1 % EX OINT: 1.0000 "application " | TOPICAL_OINTMENT | CUTANEOUS | Status: DC | PRN

## 2020-08-02 MED ORDER — BENZOCAINE-MENTHOL 20-0.5 % EX AERO
INHALATION_SPRAY | CUTANEOUS | Status: AC
  Filled 2020-08-02: qty 56

## 2020-08-02 MED ORDER — ONDANSETRON HCL 4 MG PO TABS
4.0000 mg | ORAL_TABLET | ORAL | Status: DC | PRN
  Filled 2020-08-02: qty 1

## 2020-08-02 MED ORDER — DIPHENHYDRAMINE HCL 25 MG PO CAPS: 25.0000 mg | ORAL_CAPSULE | Freq: Four times a day (QID) | ORAL | Status: DC | PRN

## 2020-08-02 MED ORDER — SIMETHICONE 80 MG PO CHEW: 80.0000 mg | CHEWABLE_TABLET | ORAL | Status: DC | PRN

## 2020-08-02 MED ORDER — ONDANSETRON HCL 4 MG/2ML IJ SOLN: 4.0000 mg | INTRAMUSCULAR | Status: DC | PRN

## 2020-08-02 NOTE — Progress Notes (Signed)
Subjective:  Doing well postpartum 5 hours day 0: She is tolerating regular diet. Her pain is controlled with PO medication. She is ambulating without difficulty. She had not yet voided since being on mother baby unit at the time of my visit this morning. She reports breastfeeding is going well.   Objective:  Vital signs in last 24 hours: Temp:  [97.8 F (36.6 C)-99.5 F (37.5 C)] 98.7 F (37.1 C) (11/22 0934) Pulse Rate:  [79-126] 93 (11/22 0934) Resp:  [16-18] 18 (11/22 0934) BP: (105-155)/(48-96) 119/73 (11/22 0934) SpO2:  [96 %-100 %] 99 % (11/22 0934)    General: NAD Pulmonary: no increased work of breathing Abdomen: non-distended, non-tender, fundus firm at level of umbilicus Extremities: no edema, no erythema, no tenderness  Results for orders placed or performed during the hospital encounter of 08/01/20 (from the past 72 hour(s))  CBC     Status: Abnormal   Collection Time: 08/01/20  8:45 AM  Result Value Ref Range   WBC 11.6 (H) 4.0 - 10.5 K/uL   RBC 4.12 3.87 - 5.11 MIL/uL   Hemoglobin 12.6 12.0 - 15.0 g/dL   HCT 81.1 36 - 46 %   MCV 90.3 80.0 - 100.0 fL   MCH 30.6 26.0 - 34.0 pg   MCHC 33.9 30.0 - 36.0 g/dL   RDW 91.4 78.2 - 95.6 %   Platelets 164 150 - 400 K/uL   nRBC 0.0 0.0 - 0.2 %    Comment: Performed at [facility], [address] [place], [place] [address]  Type and screen     Status: None   Collection Time: 08/01/20  8:45 AM  Result Value Ref Range   ABO/RH(D) O POS    Antibody Screen NEG    Sample Expiration      08/04/2020,2359 Performed at [facility], [address] [place], [place] [address]   Comprehensive metabolic panel     Status: Abnormal   Collection Time: 08/01/20  8:45 AM  Result Value Ref Range   Sodium 136 135 - 145 mmol/L   Potassium 3.7 3.5 - 5.1 mmol/L   Chloride 105 98 - 111 mmol/L   CO2 20 (L) 22 - 32 mmol/L   Glucose, Bld 135 (H) 70 - 99 mg/dL    Comment: Glucose reference range applies only to  samples taken after fasting for at least 8 hours.   BUN 7 6 - 20 mg/dL   Creatinine, Ser 6.96 0.44 - 1.00 mg/dL   Calcium 8.8 (L) 8.9 - 10.3 mg/dL   Total Protein 5.9 (L) 6.5 - 8.1 g/dL   Albumin 2.9 (L) 3.5 - 5.0 g/dL   AST 25 15 - 41 U/L   ALT 13 0 - 44 U/L   Alkaline Phosphatase 142 (H) 38 - 126 U/L   Total Bilirubin 0.5 0.3 - 1.2 mg/dL   GFR, Estimated >29 >52 mL/min    Comment: (NOTE) Calculated using the CKD-EPI Creatinine Equation (2021)    Anion gap 11 5 - 15    Comment: Performed at [facility], [address], [place], [place] [address]  ABO/Rh     Status: None   Collection Time: 08/01/20  9:33 AM  Result Value Ref Range   ABO/RH(D)      O POS Performed at [facility], [address] [place], [place] [address]   Protein / creatinine ratio, urine     Status: None   Collection Time: 08/01/20 11:20 AM  Result  Value Ref Range   Creatinine, Urine 68 mg/dL   Total Protein, Urine 8 mg/dL    Comment: NO NORMAL RANGE ESTABLISHED FOR THIS TEST   Protein Creatinine Ratio 0.12 0.00 - 0.15 mg/mg[Cre]    Comment: Performed at [facility], [address] [place], [place] [address]    Assessment:   29 y.o. G1P1001 postpartum day # 0, lactating  Plan:    1) Acute blood loss anemia - hemodynamically stable and asymptomatic - po ferrous sulfate  2) Blood Type --/--/O POS Performed at [facility], [address] [place], [place] [address]  [phone]) / Ishmael Holter 2.30 (04/13 0941) / Varicella Immune  3) TDAP status up to date  4) Feeding plan breast  5)  Education given regarding options for contraception, as well as compatibility with breast feeding if applicable.  Patient is undecided on method of birth control at the time of this visit.  She had been considering Progesterone only Pill.  6) Disposition: continue current care   Tresea Mall, CNM [facility] 08/02/2020, 10:24 AM

## 2020-08-02 NOTE — Lactation Note (Signed)
This note was copied from a baby's chart. Lactation Consultation Note  Patient Name: Sandra Arellano XBMWU'X Date: 08/02/2020 Reason for consult: Initial assessment;1st time breastfeeding;Primapara;Term  Initial lactation visit. Mom is G1P1 SVD 7 hours ago. Baby was at the breast when LC entered, on/off for combined 40 minutes. LC reviewed position and alignment, pulled down on bottom lip to flange out and explained importance of latch and position to parents. Hand expression taught, encouraged at least 8 attempted feedings throughout first 24 hours, and hand expression during sleepy times.  Reviewed newborn stomach size, feeding patterns and behaviors, output expectations, and early feeding cues. Encouraged skin to skin and rest when possible.  Lactation name/number updated on whiteboard; encouraged to call out for ongoing breastfeeding assistance.  Maternal Data Formula Feeding for Exclusion: No Has patient been taught Hand Expression?: Yes Does the patient have breastfeeding experience prior to this delivery?: No  Feeding Feeding Type: Breast Fed  LATCH Score Latch: Grasps breast easily, tongue down, lips flanged, rhythmical sucking.  Audible Swallowing: A few with stimulation  Type of Nipple: Everted at rest and after stimulation  Comfort (Breast/Nipple): Soft / non-tender  Hold (Positioning): Assistance needed to correctly position infant at breast and maintain latch. (pulled down on bottom lip)  LATCH Score: 8  Interventions Interventions: Breast feeding basics reviewed;Assisted with latch;Hand express;Adjust position;Support pillows  Lactation Tools Discussed/Used     Consult Status Consult Status: Follow-up Date: 08/02/20 Follow-up type: In-patient    Danford Bad 08/02/2020, 12:01 PM

## 2020-08-02 NOTE — Discharge Summary (Signed)
Postpartum Discharge Summary     Patient Name: Sandra Arellano DOB: June 04, 1991 MRN: [id number]  Date of admission: 08/01/2020 Delivery date:08/02/2020  Delivering provider: Prentice Docker D  Date of discharge: 08/03/2020  Admitting diagnosis: Post-dates pregnancy [O48.0] Intrauterine pregnancy: [redacted]w[redacted]d    Secondary diagnosis:  Active Problems:   Herpes   Encounter for supervision of normal first pregnancy in third trimester   Encounter for elective induction of labor   [redacted] weeks gestation of pregnancy  Additional problems: NMone    Discharge diagnosis: Term Pregnancy Delivered                                              Post partum procedures:none Augmentation: Pitocin, Cytotec and IP Foley Complications: None  Hospital course: Induction of Labor With Vaginal Delivery   29y.o. yo G1P0000 at 430w5das admitted to the hospital 08/01/2020 for induction of labor.  Indication for induction: Elective.  Patient had an uncomplicated labor course as follows: Membrane Rupture Time/Date: 8:42 PM ,08/01/2020   Delivery Method:Vaginal, Spontaneous  Episiotomy: None  Lacerations:  2nd degree;Perineal;Labial  Details of delivery can be found in separate delivery note.  Patient had a routine postpartum course. Patient is discharged home 08/03/20.  Newborn Data: Birth date:08/02/2020  Birth time:4:13 AM  Gender:Female  Living status:Living  Apgars:8 ,9  Weight:3880 g   Magnesium Sulfate received: No BMZ received: No Rhophylac:N/A MMR:No T-DaP:Given prenatally Flu: No Transfusion:No  Physical exam  Vitals:   08/02/20 1613 08/02/20 1959 08/02/20 2338 08/03/20 0900  BP: 113/77 122/71 128/77 124/85  Pulse: 81 80 85 96  Resp: _0 Temp: 98 F (36.7 C) 98.1 F (36.7 C) 98 F (36.7 C) 97.9 F (36.6 C)  TempSrc: Oral Oral Oral Oral  SpO2:  99% 99% 99%  Weight:      Height:       General: alert, cooperative and no distress Lochia: appropriate Uterine Fundus:  firm Incision: N/A DVT Evaluation: No evidence of DVT seen on physical exam. Labs: Lab Results  Component Value Date   WBC 14.0 (H) 08/03/2020   HGB 9.7 (L) 08/03/2020   HCT 28.4 (L) 08/03/2020   MCV 92.5 08/03/2020   PLT 135 (L) 08/03/2020   CMP Latest Ref Rng & Units 08/01/2020  Glucose 70 - 99 mg/dL 135(H)  BUN 6 - 20 mg/dL 7  Creatinine 0.44 - 1.00 mg/dL 0.59  Sodium 135 - 145 mmol/L 136  Potassium 3.5 - 5.1 mmol/L 3.7  Chloride 98 - 111 mmol/L 105  CO2 22 - 32 mmol/L 20(L)  Calcium 8.9 - 10.3 mg/dL 8.8(L)  Total Protein 6.5 - 8.1 g/dL 5.9(L)  Total Bilirubin 0.3 - 1.2 mg/dL 0.5  Alkaline Phos 38 - 126 U/L 142(H)  AST 15 - 41 U/L 25  ALT 0 - 44 U/L 13   Edinburgh Score: Edinburgh Postnatal Depression Scale Screening Tool 08/02/2020  I have been able to laugh and see the funny side of things. 0  I have looked forward with enjoyment to things. 0  I have blamed myself unnecessarily when things went wrong. 2  I have been anxious or worried for no good reason. 2  I have felt scared or panicky for no good reason. 0  Things have been getting on top of me. 0  I have been so unhappy that I  have had difficulty sleeping. 0  I have felt sad or miserable. 0  I have been so unhappy that I have been crying. 1  The thought of harming myself has occurred to me. 0  Edinburgh Postnatal Depression Scale Total 5      After visit meds:  Allergies as of 08/03/2020   No Known Allergies     Medication List    STOP taking these medications   BLACK ELDERBERRY(BERRY-FLOWER) PO   valACYclovir 500 MG tablet Commonly known as: Valtrex     TAKE these medications   Biotin 1 MG Caps Take by mouth.   [facility] PRENATAL GUMMY PO Take by mouth.   omeprazole 20 MG tablet Commonly known as: PRILOSEC OTC Take 20 mg by mouth as needed.        Discharge home in stable condition Infant Feeding: Breast Infant Disposition:home with mother Discharge instruction: per After Visit Summary  and Postpartum booklet. Activity: Advance as tolerated. Pelvic rest for 6 weeks.  Diet: routine diet Anticipated Birth Control: POPs Postpartum Appointment:6 weeks Additional Postpartum F/U: none Future Appointments:No future appointments. Follow up Visit:  Follow-up Information    Will Bonnet, MD. Schedule an appointment as soon as possible for a visit in 6 week(s).   Specialty: Obstetrics and Gynecology Why: Routine six week postpartum Contact information: [address] [place] [address] [phone]                   08/03/2020 Hoyt Koch, MD

## 2020-08-02 NOTE — Progress Notes (Signed)
Labor Progress Note  Sandra Arellano is a 29 y.o. G1P1001 at [redacted]w[redacted]d by ultrasound admitted for induction of labor due to Elective at term now in active labor.  Subjective: Resting comfortable in bed with epidural anesthesia in place. Experiencing constant pelvic/rectal pressure. Coping well.  Objective: BP 113/63   Pulse 98   Temp 98.4 F (36.9 C) (Oral)   Resp 18   Ht 5\' 2"  (1.575 m)   Wt 84.4 kg Comment: 186lbs  LMP 10/22/2019 (Exact Date)   SpO2 97%   Breastfeeding Unknown   BMI 34.02 kg/m    Fetal Assessment: FHT:  FHR: 125 bpm, variability: moderate,  accelerations:  Present,  decelerations:  Present : early Category/reactivity:  Category I UC:   regular, every 1.5-4 minutes - palpate moderate SVE:   10/100/+1 Membrane status: SROM Amniotic color: clear  Labs: Lab Results  Component Value Date   WBC 11.6 (H) 08/01/2020   HGB 12.6 08/01/2020   HCT 37.2 08/01/2020   MCV 90.3 08/01/2020   PLT 164 08/01/2020    Assessment / Plan: Elective induction of labor, progressing well on pitocin, now in second stage  Labor: Progressing normally PIH:  Continue to monitor BP Fetal Wellbeing:  Category I Pain Control:  Epidural I/D:  n/a Anticipated MOD:  NSVD  08/03/2020, CNM 08/02/2020, 7:46 AM

## 2020-08-03 ENCOUNTER — Telehealth: Payer: Self-pay | Admitting: Obstetrics and Gynecology

## 2020-08-03 LAB — CBC
HCT: 28.4 % — ABNORMAL LOW (ref 36.0–46.0)
Hemoglobin: 9.7 g/dL — ABNORMAL LOW (ref 12.0–15.0)
MCH: 31.6 pg (ref 26.0–34.0)
MCHC: 34.2 g/dL (ref 30.0–36.0)
MCV: 92.5 fL (ref 80.0–100.0)
Platelets: 135 10*3/uL — ABNORMAL LOW (ref 150–400)
RBC: 3.07 MIL/uL — ABNORMAL LOW (ref 3.87–5.11)
RDW: 13.6 % (ref 11.5–15.5)
WBC: 14 10*3/uL — ABNORMAL HIGH (ref 4.0–10.5)
nRBC: 0 % (ref 0.0–0.2)

## 2020-08-03 NOTE — Telephone Encounter (Signed)
Called and left voicemail for patient to call back to be scheduled. 

## 2020-08-03 NOTE — Lactation Note (Signed)
This note was copied from a baby's chart. Lactation Consultation Note  Patient Name: Sandra Arellano BPZWC'H Date: 08/03/2020 Reason for consult: Follow-up assessment;Mother's request;1st time breastfeeding;Term  Lactation follow-up per mom's request. Baby in quiet state in bassinet. Mom desires observation of putting baby to breast, wanting to gain tips on achieving wide open mouth. LC suggested support pillows and comfortable position for mom before bringing baby to breast.  Baby brought to breast in cross-cradle hold, mom working to sandwich breast and hold baby's hands away. LC did assist with moving arm to under breast, and talked mom through where to place hands for sandwiching; encouraged to keep sandwich until baby established a strong rhythmic sucking pattern and slowly release. Baby sustained latch for approximately 5 minutes before falling asleep; guidance given on how to stir baby, keep baby alert at the breast, and how to break suction with non-nutritive sucking efforts. Baby placed back in bassinet and was sleeping soundly. LC and LC student answered questions for recommended duration of breastfeeding, pump and bottle introduction, benefits of skin to skin, hand expressing for colostrum or EBM onto nipple to prevent bacterial growth, other benefits of EBM for baby, and normal course of lactation. Encouraged parents to call out with additional questions, concerns, or BF support before discharge if needed.  Maternal Data Formula Feeding for Exclusion: No Has patient been taught Hand Expression?: Yes Does the patient have breastfeeding experience prior to this delivery?: No  Feeding Feeding Type: Breast Fed  LATCH Score Latch: Grasps breast easily, tongue down, lips flanged, rhythmical sucking.  Audible Swallowing: A few with stimulation  Type of Nipple: Everted at rest and after stimulation  Comfort (Breast/Nipple): Soft / non-tender  Hold (Positioning): No assistance needed  to correctly position infant at breast. (slight adjustment)  LATCH Score: 9  Interventions Interventions: Breast feeding basics reviewed;Assisted with latch;Breast massage;Hand express;Breast compression;Adjust position;Support pillows;Comfort gels  Lactation Tools Discussed/Used     Consult Status Consult Status: Complete Date: 08/03/20 Follow-up type: Call as needed    Danford Bad 08/03/2020, 12:18 PM

## 2020-08-03 NOTE — Anesthesia Postprocedure Evaluation (Signed)
Anesthesia Post Note  Patient: Sherlene Law  Procedure(s) Performed: AN AD HOC LABOR EPIDURAL  Patient location during evaluation: Mother Baby Anesthesia Type: Epidural Level of consciousness: oriented and awake and alert Pain management: pain level controlled Vital Signs Assessment: post-procedure vital signs reviewed and stable Respiratory status: spontaneous breathing and respiratory function stable Cardiovascular status: blood pressure returned to baseline and stable Postop Assessment: no headache, no backache, no apparent nausea or vomiting and able to ambulate Anesthetic complications: no   No complications documented.   Last Vitals:  Vitals:   08/02/20 1959 08/02/20 2338  BP: 122/71 128/77  Pulse: 80 85  Resp: 20 18  Temp: 36.7 C 36.7 C  SpO2: 99% 99%    Last Pain:  Vitals:   08/02/20 2338  TempSrc: Oral  PainSc:                  Starling Manns

## 2020-08-03 NOTE — Lactation Note (Signed)
This note was copied from a baby's chart. Lactation Consultation Note  Patient Name: Sandra Arellano IOMBT'D Date: 08/03/2020 Reason for consult: Follow-up assessment;1st time breastfeeding;Primapara;Term  Lactation follow-up; pending discharge later today. Parents report cluster feeding overnight and some nipple soreness/discomfort this morning. Comfort gels were given, LC student provided guidance on wearing, cleaning, and re-storing.  Baby received circumcision this morning, impact that this may have on baby's desire to eat was reviewed along with typical behaviors and feeding patterns later on today. Encouraged hand expression between feedings as needed as breast changes start to take place. Breast fullness and engorgement and management of both reviewed.  Encouraged continued feeding with cues, tracking of void and stool diapers, anticipated growth spurts and cluster feeding. Reviewed normal course of lactation, milk supply and demand, and benefits of ongoing skin to skin. Information given for outpatient lactation services and community breastfeeding support. Encouraged to call for ongoing breastfeeding support as needed.  Maternal Data Formula Feeding for Exclusion: No Has patient been taught Hand Expression?: Yes Does the patient have breastfeeding experience prior to this delivery?: No  Feeding Feeding Type: Breast Fed  LATCH Score                   Interventions Interventions: Breast feeding basics reviewed;Hand express;Comfort gels;Coconut oil  Lactation Tools Discussed/Used     Consult Status Consult Status: PRN Date: 08/03/20 Follow-up type: Call as needed    Danford Bad 08/03/2020, 10:33 AM

## 2020-08-03 NOTE — Telephone Encounter (Signed)
-----   Message from Nadara Mustard, MD sent at 08/03/2020 11:11 AM EST ----- Regarding: Sch post partum visit w K Veal in 6 weeks

## 2020-08-03 NOTE — Progress Notes (Signed)
Discharge order received from doctor. Reviewed discharge instructions and medications with patient and answered all questions. Follow up appointment instructions given. Patient verbalized understanding. ID bands checked. Patient discharged home with infant via wheelchair by nursing/auxillary.    Sheldon Amara, RN  

## 2020-08-03 NOTE — Discharge Instructions (Signed)

## 2020-09-11 ENCOUNTER — Other Ambulatory Visit: Payer: Self-pay | Admitting: Obstetrics and Gynecology

## 2020-09-11 DIAGNOSIS — Z3401 Encounter for supervision of normal first pregnancy, first trimester: Secondary | ICD-10-CM

## 2020-09-11 DIAGNOSIS — Z3A37 37 weeks gestation of pregnancy: Secondary | ICD-10-CM

## 2020-09-11 DIAGNOSIS — B009 Herpesviral infection, unspecified: Secondary | ICD-10-CM

## 2020-09-20 ENCOUNTER — Encounter: Payer: Self-pay | Admitting: Obstetrics and Gynecology

## 2020-09-20 ENCOUNTER — Ambulatory Visit (INDEPENDENT_AMBULATORY_CARE_PROVIDER_SITE_OTHER): Payer: 59 | Admitting: Obstetrics and Gynecology

## 2020-09-20 ENCOUNTER — Ambulatory Visit: Payer: 59 | Admitting: Obstetrics and Gynecology

## 2020-09-20 ENCOUNTER — Other Ambulatory Visit: Payer: Self-pay

## 2020-09-20 NOTE — Progress Notes (Signed)
Postpartum Visit   Chief Complaint  Patient presents with  . Postpartum Care    6 week pp- pt states she would like to discuss taking postnatal vs. prenatal vitamins. States her son is currently eating about 4 oz and she is doing breast milk and formula. Patient also wants to discuss birth control options. States she has had a IUD and pill in the past, leaning more towards the pill this time.    History of Present Illness: Patient is a 30 y.o. G1P1001 presents for postpartum visit.  Date of delivery: 08/02/2020 Type of delivery: Vaginal delivery - Vacuum or forceps assisted  no Episiotomy No.  Laceration: yes, 2nd degree vaginal/perineal Pregnancy or labor problems:  no Any problems since the delivery:  no  Newborn Details:  SINGLETON :  1. Baby's name: Landis Gandy. Birth weight: 3880 grams (8 lb 9 oz) Maternal Details:  Breast Feeding:  Yes and formula (50/50) Post partum depression/anxiety noted:  no Edinburgh Post-Partum Depression Score:  6  Date of last PAP: 08/05/2019  normal   Past Medical History:  Diagnosis Date  . Acne   . HSV-2 (herpes simplex virus 2) infection     Past Surgical History:  Procedure Laterality Date  . NO PAST SURGERIES      Prior to Admission medications   Medication Sig Start Date End Date Taking? Authorizing Provider  Biotin 1 MG CAPS Take by mouth.   Yes [provider]  Prenatal MV & Min w/FA-DHA ([facility] PRENATAL GUMMY PO) Take by mouth.   Yes [provider]  omeprazole (PRILOSEC OTC) 20 MG tablet Take 20 mg by mouth as needed. Patient not taking: Reported on 09/20/2020    [provider]  valACYclovir (VALTREX) 500 MG tablet TAKE 1 TABLET BY MOUTH TWICE A DAY Patient not taking: Reported on 09/20/2020 09/12/20   Conard Novak, MD    No Known Allergies   Social History   Socioeconomic History  . Marital status: Married    Spouse name: Freida Busman  . Number of children: Not on file  . Years of education: Not on  file  . Highest education level: Not on file  Occupational History  . Not on file  Tobacco Use  . Smoking status: Never Smoker  . Smokeless tobacco: Never Used  Vaping Use  . Vaping Use: Never used  Substance and Sexual Activity  . Alcohol use: Not Currently    Comment: occas  . Drug use: No  . Sexual activity: Yes    Comment: mirena inserted 07/30/2017  Other Topics Concern  . Not on file  Social History Narrative  . Not on file   Social Determinants of Health   Financial Resource Strain: Not on file  Food Insecurity: Not on file  Transportation Needs: Not on file  Physical Activity: Not on file  Stress: Not on file  Social Connections: Not on file  Intimate Partner Violence: Not on file    Family History  Problem Relation Age of Onset  . Diabetes Maternal Grandmother   . Heart disease Maternal Grandmother   . Diabetes Maternal Grandfather   . COPD Maternal Grandfather   . Breast cancer Paternal Grandmother   . Diabetes Paternal Grandmother   . Diabetes Paternal Grandfather   . Kidney disease Paternal Grandfather   . Hypertension Father   . Ovarian cancer Neg Hx   . Colon cancer Neg Hx     Review of Systems  Constitutional: Negative.   HENT: Negative.  Eyes: Negative.   Respiratory: Negative.   Cardiovascular: Negative.   Gastrointestinal: Negative.   Genitourinary: Negative.   Musculoskeletal: Negative.   Skin: Negative.   Neurological: Negative.   Psychiatric/Behavioral: Negative.      Physical Exam BP 118/76   Ht 5\' 2"  (1.575 m)   Wt 159 lb (72.1 kg)   BMI 29.08 kg/m   Physical Exam Constitutional:      General: She is not in acute distress.    Appearance: Normal appearance. She is well-developed.  Genitourinary:     Vulva, bladder and urethral meatus normal.     No lesions in the vagina.     Right Labia: No rash, tenderness, lesions, skin changes or Bartholin's cyst.    Left Labia: No tenderness, skin changes, Bartholin's cyst or rash.     No inguinal adenopathy present in the right or left side.    Pelvic Tanner Score: 5/5.    No vaginal discharge, erythema or bleeding.      Right Adnexa: not tender, not full and no mass present.    Left Adnexa: not tender, not full and no mass present.    No cervical motion tenderness, friability, lesion or polyp.     Uterus is not enlarged, fixed or tender.     Uterus is anteverted.     No urethral tenderness or mass present.     Pelvic exam was performed with patient in the lithotomy position.  HENT:     Head: Normocephalic and atraumatic.  Eyes:     General: No scleral icterus.    Conjunctiva/sclera: Conjunctivae normal.  Pulmonary:     Effort: Pulmonary effort is normal. No respiratory distress.  Abdominal:     General: There is no distension.     Palpations: Abdomen is soft. There is no mass.     Tenderness: There is no abdominal tenderness. There is no guarding or rebound.     Hernia: There is no hernia in the left inguinal area or right inguinal area.  Musculoskeletal:        General: No swelling. Normal range of motion.     Cervical back: Normal range of motion.  Lymphadenopathy:     Lower Body: No right inguinal adenopathy. No left inguinal adenopathy.  Neurological:     General: No focal deficit present.     Mental Status: She is alert and oriented to person, place, and time.     Cranial Nerves: No cranial nerve deficit.  Skin:    General: Skin is warm and dry.     Findings: No erythema.  Psychiatric:        Mood and Affect: Mood normal.        Behavior: Behavior normal.        Judgment: Judgment normal.      Female Chaperone present during breast and/or pelvic exam.   Edinburgh Postnatal Depression Scale - 09/20/20 1033      Edinburgh Postnatal Depression Scale:  In the Past 7 Days   I have been able to laugh and see the funny side of things. 0    I have looked forward with enjoyment to things. 0    I have blamed myself unnecessarily when things went  wrong. 2    I have been anxious or worried for no good reason. 0    I have felt scared or panicky for no good reason. 1    Things have been getting on top of me. 2    I have  been so unhappy that I have had difficulty sleeping. 0    I have felt sad or miserable. 1    I have been so unhappy that I have been crying. 0    The thought of harming myself has occurred to me. 0    Edinburgh Postnatal Depression Scale Total 6          Assessment: 30 y.o. G1P1001 presenting for 6 week postpartum visit  Plan: Problem List Items Addressed This Visit   None   Visit Diagnoses    Postpartum care and examination    -  Primary     1) Contraception Education given regarding options for contraception, including oral contraceptives. Slynd samples x 4 months given.  2)  Pap - ASCCP guidelines and rational discussed.  Patient opts for routine screening interval  3) Patient underwent screening for postpartum depression with no concerns noted.  4) Follow up 1 year for routine annual exam  Thomasene Mohair, MD 09/20/2020 10:34 AM

## 2020-09-21 ENCOUNTER — Telehealth: Payer: Self-pay | Admitting: Obstetrics and Gynecology

## 2020-09-21 NOTE — Telephone Encounter (Signed)
Pt is BF and has cold sx. Calling to find out if she can take dayquil or nyquil. Suggested plain sudafed instead, as well as tylenol/NSAIDs. Having sore throat, congestion, headache. Suggested covid testing due to sx. Aware BF helps protect infant.

## 2020-09-28 ENCOUNTER — Ambulatory Visit: Payer: 59 | Admitting: Obstetrics and Gynecology

## 2020-10-12 ENCOUNTER — Other Ambulatory Visit: Payer: Self-pay | Admitting: Obstetrics and Gynecology

## 2020-10-12 DIAGNOSIS — Z3401 Encounter for supervision of normal first pregnancy, first trimester: Secondary | ICD-10-CM

## 2020-10-12 DIAGNOSIS — Z3A37 37 weeks gestation of pregnancy: Secondary | ICD-10-CM

## 2020-10-12 DIAGNOSIS — B009 Herpesviral infection, unspecified: Secondary | ICD-10-CM

## 2020-10-26 ENCOUNTER — Other Ambulatory Visit: Payer: Self-pay | Admitting: Obstetrics and Gynecology

## 2020-10-26 DIAGNOSIS — Z3A37 37 weeks gestation of pregnancy: Secondary | ICD-10-CM

## 2020-10-26 DIAGNOSIS — Z3401 Encounter for supervision of normal first pregnancy, first trimester: Secondary | ICD-10-CM

## 2020-10-26 DIAGNOSIS — B009 Herpesviral infection, unspecified: Secondary | ICD-10-CM

## 2021-01-24 ENCOUNTER — Telehealth: Payer: Self-pay

## 2021-01-24 NOTE — Telephone Encounter (Signed)
Pt's husband, Savonna Birchmeier is calling to see if the the practice is accepting new patient. Explained that the office is not accepting new patients. Pt asked to speak to carol. [phone]

## 2021-05-05 ENCOUNTER — Other Ambulatory Visit (HOSPITAL_COMMUNITY)
Admission: RE | Admit: 2021-05-05 | Discharge: 2021-05-05 | Disposition: A | Discharge status: Home / Self Care | Payer: 59 | Source: Ambulatory Visit | Attending: Obstetrics | Admitting: Obstetrics

## 2021-05-05 ENCOUNTER — Other Ambulatory Visit: Payer: Self-pay

## 2021-05-05 ENCOUNTER — Ambulatory Visit (INDEPENDENT_AMBULATORY_CARE_PROVIDER_SITE_OTHER): Payer: 59 | Admitting: Obstetrics

## 2021-05-05 ENCOUNTER — Encounter: Payer: Self-pay | Admitting: Obstetrics

## 2021-05-05 VITALS — BP 128/72 | HR 64 | Wt 153.0 lb

## 2021-05-05 DIAGNOSIS — Z1379 Encounter for other screening for genetic and chromosomal anomalies: Secondary | ICD-10-CM | POA: Diagnosis not present

## 2021-05-05 DIAGNOSIS — Z3480 Encounter for supervision of other normal pregnancy, unspecified trimester: Secondary | ICD-10-CM

## 2021-05-05 DIAGNOSIS — Z113 Encounter for screening for infections with a predominantly sexual mode of transmission: Secondary | ICD-10-CM | POA: Insufficient documentation

## 2021-05-05 DIAGNOSIS — Z3A09 9 weeks gestation of pregnancy: Secondary | ICD-10-CM | POA: Diagnosis not present

## 2021-05-05 DIAGNOSIS — N926 Irregular menstruation, unspecified: Secondary | ICD-10-CM | POA: Diagnosis not present

## 2021-05-05 LAB — POCT URINE PREGNANCY: Preg Test, Ur: POSITIVE — AB

## 2021-05-05 NOTE — Progress Notes (Signed)
New Obstetric Patient H&P    Chief Complaint: "Desires prenatal care"   History of Present Illness: Patient is a 30 y.o. G2P1001 Not Hispanic or Latino female, LMP uncertain- 6/20 presents with amenorrhea and positive home pregnancy test. Based on her  LMP, her EDD is Estimated Date of Delivery: 12/06/21 and her EGA is [redacted]w[redacted]d. Cycles are 5. days, regular, and occur approximately every : 28 days. Her last pap smear was about 1 years ago and was no abnormalities.    She had a urine pregnancy test which was positive about 4 week(s)  ago. Her last menstrual period was normal and lasted for  about 5 day(s). Since her LMP she claims she has experienced some nausea. She denies vaginal bleeding. Her past medical history is noncontributory. Her prior pregnancies are notable for none She had a vaginal delivery in November of 2021 Since her LMP, she admits to the use of tobacco products  no She claims she has gained   no pounds since the start of her pregnancy.  There are cats in the home in the home  no She admits close contact with children on a regular basis  yes  She has had chicken pox in the past yes She has had Tuberculosis exposures, symptoms, or previously tested positive for TB   no Current or past history of domestic violence. no  Genetic Screening/Teratology Counseling: (Includes patient, baby's father, or anyone in either family with:)   1. Patient's age >/= 32 at [facility]  no 2. Thalassemia ([place], [place], Mediterranean, or Asian background): MCV<80  no 3. Neural tube defect (meningomyelocele, spina bifida, anencephaly)  no 4. Congenital heart defect  no  5. Down syndrome  no 6. Tay-Sachs (Jewish, [place]  no 7. Canavan's Disease  no 8. Sickle cell disease or trait (African)  no  9. Hemophilia or other blood disorders  no  10. Muscular dystrophy  no  11. Cystic fibrosis  no  12. Huntington's Chorea  no  13. Mental retardation/autism  no 14. Other inherited genetic or  chromosomal disorder  no 15. Maternal metabolic disorder (DM, PKU, etc)  no 16. Patient or FOB with a child with a birth defect not listed above no  16a. Patient or FOB with a birth defect themselves no 17. Recurrent pregnancy loss, or stillbirth  no  18. Any medications since LMP other than prenatal vitamins (include vitamins, supplements, OTC meds, drugs, alcohol)  no 19. Any other genetic/environmental exposure to discuss  no  Infection History:   1. Lives with someone with TB or TB exposed  no  2. Patient or partner has history of genital herpes  yes 3. Rash or viral illness since LMP  no 4. History of STI (GC, CT, HPV, syphilis, HIV)  no 5. History of recent travel :  no  Other pertinent information:  no     Review of Systems:10 point review of systems negative unless otherwise noted in HPI  Past Medical History:  Past Medical History:  Diagnosis Date  . Acne   . HSV-2 (herpes simplex virus 2) infection     Past Surgical History:  Past Surgical History:  Procedure Laterality Date  . NO PAST SURGERIES      Gynecologic History: Patient's last menstrual period was 03/01/2021 (approximate).  Obstetric History: G2P1001  Family History:  Family History  Problem Relation Age of Onset  . Diabetes Maternal Grandmother   . Heart disease Maternal Grandmother   . Diabetes  Maternal Grandfather   . COPD Maternal Grandfather   . Breast cancer Paternal Grandmother   . Diabetes Paternal Grandmother   . Diabetes Paternal Grandfather   . Kidney disease Paternal Grandfather   . Hypertension Father   . Ovarian cancer Neg Hx   . Colon cancer Neg Hx     Social History:  Social History   Socioeconomic History  . Marital status: Married    Spouse name: Freida Busman  . Number of children: Not on file  . Years of education: Not on file  . Highest education level: Not on file  Occupational History  . Not on file  Tobacco Use  . Smoking status: Never  . Smokeless tobacco: Never   Vaping Use  . Vaping Use: Never used  Substance and Sexual Activity  . Alcohol use: Not Currently    Comment: occas  . Drug use: No  . Sexual activity: Yes    Comment: mirena inserted 07/30/2017  Other Topics Concern  . Not on file  Social History Narrative  . Not on file   Social Determinants of Health   Financial Resource Strain: Not on file  Food Insecurity: Not on file  Transportation Needs: Not on file  Physical Activity: Not on file  Stress: Not on file  Social Connections: Not on file  Intimate Partner Violence: Not on file    Allergies:  No Known Allergies  Medications: Prior to Admission medications   Medication Sig Start Date End Date Taking? Authorizing Provider  Prenatal MV & Min w/FA-DHA ([facility] PRENATAL GUMMY PO) Take by mouth.   Yes [provider]  Biotin 1 MG CAPS Take by mouth. Patient not taking: Reported on 05/05/2021    [provider]  omeprazole (PRILOSEC OTC) 20 MG tablet Take 20 mg by mouth as needed.    [provider]  valACYclovir (VALTREX) 500 MG tablet TAKE 1 TABLET BY MOUTH TWICE A DAY 10/26/20   Conard Novak, MD    Physical Exam Vitals: Blood pressure 128/72, pulse 64, weight 153 lb (69.4 kg), last menstrual period 03/01/2021, unknown if currently breastfeeding.  General: NAD HEENT: normocephalic, anicteric Thyroid: no enlargement, no palpable nodules Pulmonary: No increased work of breathing, CTAB Cardiovascular: RRR, distal pulses 2+ Abdomen: NABS, soft, non-tender, non-distended.  Umbilicus without lesions.  No hepatomegaly, splenomegaly or masses palpable. No evidence of hernia  Genitourinary:  External: Normal external female genitalia.  Normal urethral meatus, normal  Bartholin's and Skene's glands.    Vagina: Normal vaginal mucosa, no evidence of prolapse.    Cervix: Grossly normal in appearance, no bleeding  Uterus: retroverted Non-enlarged, mobile, normal contour.  No CMT  Adnexa: ovaries  non-enlarged, no adnexal masses  Rectal: deferred Extremities: no edema, erythema, or tenderness Neurologic: Grossly intact Psychiatric: mood appropriate, affect full   Assessment: 30 y.o. G2P1001 at [redacted]w[redacted]d presenting to initiate prenatal care  Plan: 1) Avoid alcoholic beverages. 2) Patient encouraged not to smoke.  3) Discontinue the use of all non-medicinal drugs and chemicals.  4) Take prenatal vitamins daily.  5) Nutrition, food safety (fish, cheese advisories, and high nitrite foods) and exercise discussed. 6) Hospital and practice style discussed with cross coverage system.  7) Genetic Screening, such as with 1st Trimester Screening, cell free fetal DNA, AFP testing, and Ultrasound, as well as with amniocentesis and CVS as appropriate, is discussed with patient. At the conclusion of today's visit patient requested genetic testing 8) Patient is asked about travel to areas at risk for the  Zika virus, and counseled to avoid travel and exposure to mosquitoes or sexual partners who may have themselves been exposed to the virus. Testing is discussed, and will be ordered as appropriate.   Aptima swab sent. No pap needed. RTC in 2-3 weeks for dating scan and blood work, including MaternT testing. Mirna Mires, CNM  05/05/2021 3:16 PM

## 2021-05-05 NOTE — Addendum Note (Signed)
Addended by: Fortunato Curling R on: 05/05/2021 03:48 PM   Modules accepted: Orders

## 2021-05-05 NOTE — Progress Notes (Signed)
NOB 

## 2021-05-07 LAB — URINE CULTURE

## 2021-05-10 LAB — CERVICOVAGINAL ANCILLARY ONLY
Bacterial Vaginitis (gardnerella): NEGATIVE
Candida Glabrata: NEGATIVE
Candida Vaginitis: NEGATIVE
Chlamydia: NEGATIVE
Comment: NEGATIVE
Comment: NEGATIVE
Comment: NEGATIVE
Comment: NEGATIVE
Comment: NORMAL
Neisseria Gonorrhea: NEGATIVE

## 2021-05-19 ENCOUNTER — Encounter: Payer: Self-pay | Admitting: Obstetrics and Gynecology

## 2021-05-19 ENCOUNTER — Other Ambulatory Visit: Payer: Self-pay

## 2021-05-19 ENCOUNTER — Ambulatory Visit (INDEPENDENT_AMBULATORY_CARE_PROVIDER_SITE_OTHER): Payer: 59 | Admitting: Obstetrics and Gynecology

## 2021-05-19 VITALS — BP 124/79 | HR 77 | Wt 157.0 lb

## 2021-05-19 DIAGNOSIS — Z3A11 11 weeks gestation of pregnancy: Secondary | ICD-10-CM

## 2021-05-19 DIAGNOSIS — B009 Herpesviral infection, unspecified: Secondary | ICD-10-CM

## 2021-05-19 DIAGNOSIS — O98811 Other maternal infectious and parasitic diseases complicating pregnancy, first trimester: Secondary | ICD-10-CM | POA: Diagnosis not present

## 2021-05-19 DIAGNOSIS — Z3481 Encounter for supervision of other normal pregnancy, first trimester: Secondary | ICD-10-CM

## 2021-05-19 DIAGNOSIS — Z3480 Encounter for supervision of other normal pregnancy, unspecified trimester: Secondary | ICD-10-CM

## 2021-05-19 DIAGNOSIS — Z1379 Encounter for other screening for genetic and chromosomal anomalies: Secondary | ICD-10-CM

## 2021-05-19 NOTE — Progress Notes (Signed)
ULTRASOUND REPORT  Location: [facility] Date of Service: 05/19/2021   Indications:dating (transabdominal) Findings:  Singleton intrauterine pregnancy is visualized with a CRL consistent with [redacted]w[redacted]d gestation, giving an (U/S) EDD of 12/04/2021. The (U/S) EDD is consistent with the clinically established EDD of 12/06/20.  FHR: 171 BPM CRL measurement: 48.4 mm Yolk sac is not visualized. Amnion: not visualized   Right Ovary is not visualized. Left Ovary is not visualized. Corpus luteal cyst:  is not visualized Survey of the adnexa demonstrates no adnexal masses. There is no free peritoneal fluid in the cul de sac.  Impression: 1. [redacted]w[redacted]d Viable Singleton Intrauterine pregnancy by U/S. 2. (U/S) EDD is consistent with Clinically established EDD of 12/06/2021.  There is a viable singleton gestation.  Detailed evaluation of the fetal anatomy is precluded by early gestational age.  It must be noted that a normal ultrasound particular at this early gestational age is unable to rule out fetal aneuploidy, risk of first trimester miscarriage, or anatomic birth defects.  I personally performed the ultrasound and interpreted the images.   Thomasene Mohair, MD, [facility], [facility] 05/19/2021 11:51 AM

## 2021-05-19 NOTE — Addendum Note (Signed)
Addended by: Kathlene Cote on: 05/19/2021 12:02 PM   Modules accepted: Orders

## 2021-05-19 NOTE — Progress Notes (Signed)
  Routine Prenatal Care Visit  Subjective  Sandra Arellano is a 30 y.o. G2P1001 at [redacted]w[redacted]d being seen today for ongoing prenatal care.  She is currently monitored for the following issues for this low-risk pregnancy and has Herpes and Supervision of other normal pregnancy, antepartum on their problem list.  ----------------------------------------------------------------------------------- Patient reports no complaints.    . Vag. Bleeding: None.   . Leaking Fluid denies.  Bedside u/s today confirms EDD (see report under NOTES) ----------------------------------------------------------------------------------- The following portions of the patient's history were reviewed and updated as appropriate: allergies, current medications, past family history, past medical history, past social history, past surgical history and problem list. Problem list updated.  Objective  Blood pressure 124/79, pulse 77, weight 157 lb (71.2 kg), last menstrual period 03/01/2021, unknown if currently breastfeeding. Pregravid weight 160 lb (72.6 kg) Total Weight Gain -3 lb (-1.361 kg) Urinalysis: Urine Protein    Urine Glucose    Fetal Status: Fetal Heart Rate (bpm): 171         General:  Alert, oriented and cooperative. Patient is in no acute distress.  Skin: Skin is warm and dry. No rash noted.   Cardiovascular: Normal heart rate noted  Respiratory: Normal respiratory effort, no problems with respiration noted  Abdomen: Soft, gravid, appropriate for gestational age.       Pelvic:  Cervical exam deferred        Extremities: Normal range of motion.     Mental Status: Normal mood and affect. Normal behavior. Normal judgment and thought content.   Assessment   30 y.o. G2P1001 at [redacted]w[redacted]d by  12/06/2021, by Last Menstrual Period presenting for routine prenatal visit  Plan   pregnancy 2 Problems (from 05/05/21 to present)     Problem Noted Resolved   Supervision of other normal pregnancy, antepartum 05/05/2021 by  Mirna Mires, CNM No   Overview Signed 05/05/2021  3:10 PM by Mirna Mires, CNM     Nursing Staff Provider  Office Location  [facility]  With an MD  Language  English Anatomy US    Flu Vaccine   Genetic Screen  NIPS:   TDaP vaccine    Hgb A1C or  GTT Early : Third trimester :   Covid    LAB RESULTS   Rhogam   Blood Type --/--/O POS Performed at [facility], [address] [place], [place] [address]  [phone])   Feeding Plan  Antibody NEG (11/21 0845)  Contraception  Rubella    Circumcision  RPR NON REACTIVE (11/21 0845)   Pediatrician   HBsAg     Support Person  HIV    Prenatal Classes  Varicella     GBS Negative/-- (10/19 1226)(For PCN allergy, check sensitivities)   BTL Consent     VBAC Consent  Pap      Hgb Electro      CF      SMA                   Preterm labor symptoms and general obstetric precautions including but not limited to vaginal bleeding, contractions, leaking of fluid and fetal movement were reviewed in detail with the patient. Please refer to After Visit Summary for other counseling recommendations.   - NOB labs today, ?NIPT - Anatomy u/s ordered  Return in about 4 weeks (around 06/16/2021) for ROB.  Thomasene Mohair, MD, [facility], [facility] 05/19/2021 11:48 AM

## 2021-05-23 LAB — MATERNIT 21 PLUS CORE, BLOOD
Fetal Fraction: 6
Result (T21): NEGATIVE
Trisomy 13 (Patau syndrome): NEGATIVE
Trisomy 18 (Edwards syndrome): NEGATIVE
Trisomy 21 (Down syndrome): NEGATIVE

## 2021-05-23 LAB — RPR+RH+ABO+RUB AB+AB SCR+CB...
Antibody Screen: NEGATIVE
HIV Screen 4th Generation wRfx: NONREACTIVE
Hematocrit: 39.4 % (ref 34.0–46.6)
Hemoglobin: 13.3 g/dL (ref 11.1–15.9)
Hepatitis B Surface Ag: NEGATIVE
MCH: 29.6 pg (ref 26.6–33.0)
MCHC: 33.8 g/dL (ref 31.5–35.7)
MCV: 88 fL (ref 79–97)
Platelets: 207 10*3/uL (ref 150–450)
RBC: 4.5 x10E6/uL (ref 3.77–5.28)
RDW: 12.1 % (ref 11.7–15.4)
RPR Ser Ql: NONREACTIVE
Rh Factor: POSITIVE
Rubella Antibodies, IGG: 1.67 index (ref >0.99)
Varicella zoster IgG: 1792 index (ref >165)
WBC: 11.1 10*3/uL — ABNORMAL HIGH (ref 3.4–10.8)

## 2021-06-21 ENCOUNTER — Other Ambulatory Visit: Payer: Self-pay

## 2021-06-21 ENCOUNTER — Ambulatory Visit (INDEPENDENT_AMBULATORY_CARE_PROVIDER_SITE_OTHER): Payer: 59 | Admitting: Obstetrics and Gynecology

## 2021-06-21 ENCOUNTER — Encounter: Payer: Self-pay | Admitting: Obstetrics and Gynecology

## 2021-06-21 VITALS — BP 122/70 | Wt 160.0 lb

## 2021-06-21 DIAGNOSIS — Z3482 Encounter for supervision of other normal pregnancy, second trimester: Secondary | ICD-10-CM

## 2021-06-21 DIAGNOSIS — B009 Herpesviral infection, unspecified: Secondary | ICD-10-CM

## 2021-06-21 DIAGNOSIS — Z3A16 16 weeks gestation of pregnancy: Secondary | ICD-10-CM

## 2021-06-21 NOTE — Progress Notes (Signed)
  Routine Prenatal Care Visit  Subjective  Sandra Arellano is a 30 y.o. G2P1001 at [redacted]w[redacted]d being seen today for ongoing prenatal care.  She is currently monitored for the following issues for this low-risk pregnancy and has Herpes and Supervision of other normal pregnancy, antepartum on their problem list.  ----------------------------------------------------------------------------------- Patient reports no complaints.   Contractions: Not present. Vag. Bleeding: None.  Movement: Absent. Leaking Fluid denies.  ----------------------------------------------------------------------------------- The following portions of the patient's history were reviewed and updated as appropriate: allergies, current medications, past family history, past medical history, past social history, past surgical history and problem list. Problem list updated.  Objective  Blood pressure 122/70, weight 160 lb (72.6 kg), last menstrual period 03/01/2021, unknown if currently breastfeeding. Pregravid weight 160 lb (72.6 kg) Total Weight Gain 0 lb (0 kg) Urinalysis: Urine Protein    Urine Glucose    Fetal Status: Fetal Heart Rate (bpm): 150   Movement: Absent     General:  Alert, oriented and cooperative. Patient is in no acute distress.  Skin: Skin is warm and dry. No rash noted.   Cardiovascular: Normal heart rate noted  Respiratory: Normal respiratory effort, no problems with respiration noted  Abdomen: Soft, gravid, appropriate for gestational age. Pain/Pressure: Absent     Pelvic:  Cervical exam deferred        Extremities: Normal range of motion.     Mental Status: Normal mood and affect. Normal behavior. Normal judgment and thought content.   Assessment   30 y.o. G2P1001 at [redacted]w[redacted]d by  12/06/2021, by Last Menstrual Period presenting for routine prenatal visit  Plan   pregnancy 2 Problems (from 05/05/21 to present)     Problem Noted Resolved   Supervision of other normal pregnancy, antepartum 05/05/2021 by  Mirna Mires, CNM No   Overview Addendum 05/23/2021  5:10 PM by Mirna Mires, CNM     Nursing Staff Provider  Office Location  [facility] Dating  With an MD  Language  English Anatomy US    Flu Vaccine   Genetic Screen  NIPS: MaternT- xy, negative  TDaP vaccine    Hgb A1C or  GTT Early : Third trimester :   Covid    LAB RESULTS   Rhogam   Blood Type --/--/O POS Performed at [facility], [address] [place], [place] [address]  [phone])   Feeding Plan  Antibody NEG (11/21 0845)  Contraception  Rubella    Circumcision  RPR NON REACTIVE (11/21 0845)   Pediatrician   HBsAg     Support Person  HIV    Prenatal Classes  Varicella     GBS Negative/-- (10/19 1226)(For PCN allergy, check sensitivities)   BTL Consent     VBAC Consent  Pap      Hgb Electro      CF      SMA                   Preterm labor symptoms and general obstetric precautions including but not limited to vaginal bleeding, contractions, leaking of fluid and fetal movement were reviewed in detail with the patient. Please refer to After Visit Summary for other counseling recommendations.   Return for Keep previously scheduled appointments. Anatomy u/s on 11/2   Thomasene Mohair, MD, [facility], [facility] 06/21/2021 9:48 AM

## 2021-07-13 ENCOUNTER — Other Ambulatory Visit: Payer: Self-pay

## 2021-07-13 ENCOUNTER — Ambulatory Visit
Admission: RE | Admit: 2021-07-13 | Discharge: 2021-07-13 | Disposition: A | Discharge status: Home / Self Care | Payer: 59 | Source: Ambulatory Visit | Attending: Obstetrics and Gynecology | Admitting: Obstetrics and Gynecology

## 2021-07-13 DIAGNOSIS — Z3481 Encounter for supervision of other normal pregnancy, first trimester: Secondary | ICD-10-CM | POA: Diagnosis not present

## 2021-07-14 ENCOUNTER — Ambulatory Visit (INDEPENDENT_AMBULATORY_CARE_PROVIDER_SITE_OTHER): Payer: 59 | Admitting: Obstetrics and Gynecology

## 2021-07-14 ENCOUNTER — Encounter: Payer: Self-pay | Admitting: Obstetrics and Gynecology

## 2021-07-14 VITALS — BP 120/74

## 2021-07-14 DIAGNOSIS — B009 Herpesviral infection, unspecified: Secondary | ICD-10-CM

## 2021-07-14 DIAGNOSIS — Z3482 Encounter for supervision of other normal pregnancy, second trimester: Secondary | ICD-10-CM

## 2021-07-14 DIAGNOSIS — Z3A19 19 weeks gestation of pregnancy: Secondary | ICD-10-CM

## 2021-07-14 NOTE — Addendum Note (Signed)
Addended by: Thomasene Mohair D on: 07/14/2021 01:31 PM   Modules accepted: Orders

## 2021-07-14 NOTE — Progress Notes (Addendum)
Routine Prenatal Care Visit  Subjective  Sandra Arellano is a 30 y.o. G2P1001 at [redacted]w[redacted]d being seen today for ongoing prenatal care.  She is currently monitored for the following issues for this low-risk pregnancy and has Herpes and Supervision of other normal pregnancy, antepartum on their problem list.  ----------------------------------------------------------------------------------- Patient reports no complaints.   Contractions: Not present. Vag. Bleeding: None.  Movement: Present. Leaking Fluid denies.  ----------------------------------------------------------------------------------- The following portions of the patient's history were reviewed and updated as appropriate: allergies, current medications, past family history, past medical history, past social history, past surgical history and problem list. Problem list updated.  Objective  Blood pressure 120/74, last menstrual period 03/01/2021, unknown if currently breastfeeding. Pregravid weight 160 lb (72.6 kg) Total Weight Gain 0 lb (0 kg) Urinalysis: Urine Protein    Urine Glucose    Fetal Status: Fetal Heart Rate (bpm): 140   Movement: Present     General:  Alert, oriented and cooperative. Patient is in no acute distress.  Skin: Skin is warm and dry. No rash noted.   Cardiovascular: Normal heart rate noted  Respiratory: Normal respiratory effort, no problems with respiration noted  Abdomen: Soft, gravid, appropriate for gestational age. Pain/Pressure: Absent     Pelvic:  Cervical exam deferred        Extremities: Normal range of motion.     Mental Status: Normal mood and affect. Normal behavior. Normal judgment and thought content.   Assessment   30 y.o. G2P1001 at [redacted]w[redacted]d by  12/06/2021, by Last Menstrual Period presenting for routine prenatal visit  Plan   pregnancy 2 Problems (from 05/05/21 to present)     Problem Noted Resolved   Supervision of other normal pregnancy, antepartum 05/05/2021 by Mirna Mires, CNM No    Overview Addendum 05/23/2021  5:10 PM by Mirna Mires, CNM     Nursing Staff Provider  Office Location  [facility]  With an MD  Language  English Anatomy US    Flu Vaccine   Genetic Screen  NIPS: MaternT- xy, negative  TDaP vaccine    Hgb A1C or  GTT Early : Third trimester :   Covid    LAB RESULTS   Rhogam   Blood Type --/--/O POS Performed at [facility], [address] [place], [place] [address]  [phone])   Feeding Plan  Antibody NEG (11/21 0845)  Contraception  Rubella    Circumcision  RPR NON REACTIVE (11/21 0845)   Pediatrician   HBsAg     Support Person  HIV    Prenatal Classes  Varicella     GBS Negative/-- (10/19 1226)(For PCN allergy, check sensitivities)   BTL Consent     VBAC Consent  Pap      Hgb Electro      CF      SMA                   Preterm labor symptoms and general obstetric precautions including but not limited to vaginal bleeding, contractions, leaking of fluid and fetal movement were reviewed in detail with the patient. Please refer to After Visit Summary for other counseling recommendations.   - anatomy u/s from yesterday is still not read by radiology. Will follow up  Return in about 4 weeks (around 08/11/2021) for ROB.   Thomasene Mohair, MD, [facility], [facility] 07/14/2021 9:39 AM    ADDENDUM: Anatomy report showed no anomalies. However, the cardiac outflow tracts were not  well visualized.  Order for completion anatomy ultrasound placed. Message sent to patient to let her know.

## 2021-08-10 ENCOUNTER — Ambulatory Visit
Admission: RE | Admit: 2021-08-10 | Discharge: 2021-08-10 | Disposition: A | Discharge status: Home / Self Care | Payer: 59 | Source: Ambulatory Visit | Attending: Obstetrics and Gynecology | Admitting: Obstetrics and Gynecology

## 2021-08-10 ENCOUNTER — Other Ambulatory Visit: Payer: Self-pay

## 2021-08-10 DIAGNOSIS — Z3482 Encounter for supervision of other normal pregnancy, second trimester: Secondary | ICD-10-CM | POA: Insufficient documentation

## 2021-08-11 ENCOUNTER — Encounter: Payer: 59 | Admitting: Obstetrics

## 2021-08-11 ENCOUNTER — Ambulatory Visit (INDEPENDENT_AMBULATORY_CARE_PROVIDER_SITE_OTHER): Payer: 59 | Admitting: Obstetrics

## 2021-08-11 ENCOUNTER — Encounter: Payer: Self-pay | Admitting: Obstetrics

## 2021-08-11 DIAGNOSIS — Z3A23 23 weeks gestation of pregnancy: Secondary | ICD-10-CM

## 2021-08-11 DIAGNOSIS — Z3480 Encounter for supervision of other normal pregnancy, unspecified trimester: Secondary | ICD-10-CM

## 2021-08-11 NOTE — Progress Notes (Signed)
Routine Prenatal Care Visit- Virtual Visit  Subjective   Virtual Visit via Telephone Note  I connected with@ on 08/11/21 at  8:50 AM EST by telephone and verified that I am speaking with the correct person using two identifiers.   I discussed the limitations, risks, security and privacy concerns of performing an evaluation and management service by telephone and the availability of in person appointments. I also discussed with the patient that there may be a patient responsible charge related to this service. The patient expressed understanding and agreed to proceed.  The patient was at home I spoke with the patient from my  office The names of people involved in this encounter were: Areana , and Nini Cavan [employer]  .   Sandra Arellano is a 30 y.o. G2P1001 at [redacted]w[redacted]d being seen today for ongoing prenatal care.  She is currently monitored for the following issues for this low-risk pregnancy and has Herpes and Supervision of other normal pregnancy, antepartum on their problem list.  ----------------------------------------------------------------------------------- Patient reports no complaints.    .  .   . Denies leaking of fluid.  ----------------------------------------------------------------------------------- The following portions of the patient's history were reviewed and updated as appropriate: allergies, current medications, past family history, past medical history, past social history, past surgical history and problem list. Problem list updated.   Objective  Last menstrual period 03/01/2021, unknown if currently breastfeeding. Pregravid weight 160 lb (72.6 kg) Total Weight Gain 0 lb (0 kg) Urinalysis:      Fetal Status:           Physical Exam could not be performed. Because of the COVID-19 outbreak this visit was performed over the phone and not in person.   Assessment   30 y.o. G2P1001 at [redacted]w[redacted]d by  12/06/2021, by Last Menstrual Period presenting for routine prenatal  visit  Plan   pregnancy 2 Problems (from 05/05/21 to present)    Problem Noted Resolved   Supervision of other normal pregnancy, antepartum 05/05/2021 by Mirna Mires, CNM No   Overview Addendum 05/23/2021  5:10 PM by Mirna Mires, CNM     Nursing Staff Provider  Office Location  [facility]ating  With an MD  Language  English Anatomy US    Flu Vaccine   Genetic Screen  NIPS: MaternT- xy, negative  TDaP vaccine    Hgb A1C or  GTT Early : Third trimester :   Covid    LAB RESULTS   Rhogam   Blood Type --/--/O POS Performed at [facility], [address] [place], [place] [address]  [phone])   Feeding Plan  Antibody NEG (11/21 0845)  Contraception  Rubella    Circumcision  RPR NON REACTIVE (11/21 0845)   Pediatrician   HBsAg     Support Person  HIV    Prenatal Classes  Varicella     GBS Negative/-- (10/19 1226)(For PCN allergy, check sensitivities)   BTL Consent     VBAC Consent  Pap      Hgb Electro      CF      SMA                   Gestational age appropriate obstetric precautions including but not limited to vaginal bleeding, contractions, leaking of fluid and fetal movement were reviewed in detail with the patient.     Follow Up Instructions:    I discussed the assessment and treatment plan with the patient. The patient was  provided an opportunity to ask questions and all were answered. The patient agreed with the plan and demonstrated an understanding of the instructions.   The patient was advised to call back or seek an in-person evaluation if the symptoms worsen or if the condition fails to improve as anticipated.  I provided 15 minutes of non-face-to-face time during this encounter.  Return in about 4 weeks (around 09/08/2021) for return OB, 28 week labs.  Mirna Mires, CNM  08/11/2021 5:28 PM   [facility], [facility] 08/11/2021 5:27 PM

## 2021-09-08 ENCOUNTER — Other Ambulatory Visit: Payer: Self-pay

## 2021-09-08 ENCOUNTER — Ambulatory Visit (INDEPENDENT_AMBULATORY_CARE_PROVIDER_SITE_OTHER): Payer: 59 | Admitting: Advanced Practice Midwife

## 2021-09-08 ENCOUNTER — Encounter: Payer: Self-pay | Admitting: Advanced Practice Midwife

## 2021-09-08 ENCOUNTER — Other Ambulatory Visit: Payer: 59

## 2021-09-08 VITALS — BP 122/72 | Wt 166.0 lb

## 2021-09-08 DIAGNOSIS — Z3480 Encounter for supervision of other normal pregnancy, unspecified trimester: Secondary | ICD-10-CM

## 2021-09-08 DIAGNOSIS — Z3A27 27 weeks gestation of pregnancy: Secondary | ICD-10-CM

## 2021-09-08 DIAGNOSIS — Z3482 Encounter for supervision of other normal pregnancy, second trimester: Secondary | ICD-10-CM

## 2021-09-08 LAB — POCT URINALYSIS DIPSTICK OB
Glucose, UA: NEGATIVE
POC,PROTEIN,UA: NEGATIVE

## 2021-09-08 NOTE — Progress Notes (Signed)
°  Routine Prenatal Care Visit  Subjective  Sandra Arellano is a 30 y.o. G2P1001 at [redacted]w[redacted]d being seen today for ongoing prenatal care.  She is currently monitored for the following issues for this low-risk pregnancy and has Herpes and Supervision of other normal pregnancy, antepartum on their problem list.  ----------------------------------------------------------------------------------- Patient reports some pain and bulging at her umbilicus that she noticed last night.   Contractions: Not present. Vag. Bleeding: None.  Movement: Present. Leaking Fluid denies.  ----------------------------------------------------------------------------------- The following portions of the patient's history were reviewed and updated as appropriate: allergies, current medications, past family history, past medical history, past social history, past surgical history and problem list. Problem list updated.  Objective  Blood pressure 122/72, weight 166 lb (75.3 kg), last menstrual period 03/01/2021, unknown if currently breastfeeding. Pregravid weight 160 lb (72.6 kg) Total Weight Gain 6 lb (2.722 kg) Urinalysis: Urine Protein Negative  Urine Glucose Negative  Fetal Status: Fetal Heart Rate (bpm): 142 Fundal Height: 27 cm Movement: Present     General:  Alert, oriented and cooperative. Patient is in no acute distress.  Skin: Skin is warm and dry. No rash noted.   Cardiovascular: Normal heart rate noted  Respiratory: Normal respiratory effort, no problems with respiration noted  Abdomen: Soft, gravid, appropriate for gestational age. Pain/Pressure: Absent, periumbilical bulging felt when patient standing, unable to palpate in supine  Pelvic:  Cervical exam deferred        Extremities: Normal range of motion.  Edema: None  Mental Status: Normal mood and affect. Normal behavior. Normal judgment and thought content.   Assessment   30 y.o. G2P1001 at [redacted]w[redacted]d by  12/06/2021, by Last Menstrual Period presenting for  routine prenatal visit, small umbilical hernia likely  Plan   pregnancy 2 Problems (from 05/05/21 to present)    Problem Noted Resolved   Supervision of other normal pregnancy, antepartum 05/05/2021 by Mirna Mires, CNM No   Overview Addendum 05/23/2021  5:10 PM by Mirna Mires, CNM     Nursing Staff Provider  Office Location  [facility]ating  With an MD  Language  English Anatomy US    Flu Vaccine   Genetic Screen  NIPS: MaternT- xy, negative  TDaP vaccine    Hgb A1C or  GTT Early : Third trimester :   Covid    LAB RESULTS   Rhogam   Blood Type --/--/O POS Performed at [facility], [address] [place], [place] [address]  [phone])   Feeding Plan  Antibody NEG (11/21 0845)  Contraception  Rubella    Circumcision  RPR NON REACTIVE (11/21 0845)   Pediatrician   HBsAg     Support Person  HIV    Prenatal Classes  Varicella     GBS Negative/-- (10/19 1226)(For PCN allergy, check sensitivities)   BTL Consent     VBAC Consent  Pap      Hgb Electro      CF      SMA                28 week labs today   Preterm labor symptoms and general obstetric precautions including but not limited to vaginal bleeding, contractions, leaking of fluid and fetal movement were reviewed in detail with the patient. Please refer to After Visit Summary for other counseling recommendations.   Return in about 2 weeks (around 09/22/2021) for rob.  Tresea Mall, CNM 09/08/2021 9:28 AM

## 2021-09-08 NOTE — Progress Notes (Signed)
ROB - 1hr gtt, no concerns. RM 5 

## 2021-09-09 LAB — 28 WEEK RH+PANEL
Basophils Absolute: 0.1 10*3/uL (ref 0.0–0.2)
Basos: 1 %
EOS (ABSOLUTE): 0.1 10*3/uL (ref 0.0–0.4)
Eos: 1 %
Gestational Diabetes Screen: 84 mg/dL (ref 70–139)
HIV Screen 4th Generation wRfx: NONREACTIVE
Hematocrit: 39.1 % (ref 34.0–46.6)
Hemoglobin: 13.4 g/dL (ref 11.1–15.9)
Immature Grans (Abs): 0.1 10*3/uL (ref 0.0–0.1)
Immature Granulocytes: 1 %
Lymphocytes Absolute: 1.7 10*3/uL (ref 0.7–3.1)
Lymphs: 19 %
MCH: 30.8 pg (ref 26.6–33.0)
MCHC: 34.3 g/dL (ref 31.5–35.7)
MCV: 90 fL (ref 79–97)
Monocytes Absolute: 0.6 10*3/uL (ref 0.1–0.9)
Monocytes: 7 %
Neutrophils Absolute: 6.4 10*3/uL (ref 1.4–7.0)
Neutrophils: 71 %
Platelets: 156 10*3/uL (ref 150–450)
RBC: 4.35 x10E6/uL (ref 3.77–5.28)
RDW: 11.9 % (ref 11.7–15.4)
RPR Ser Ql: NONREACTIVE
WBC: 8.8 10*3/uL (ref 3.4–10.8)

## 2021-09-11 ENCOUNTER — Encounter: Payer: Self-pay | Admitting: Licensed Practical Nurse

## 2021-09-22 ENCOUNTER — Other Ambulatory Visit: Payer: Self-pay

## 2021-09-22 ENCOUNTER — Ambulatory Visit (INDEPENDENT_AMBULATORY_CARE_PROVIDER_SITE_OTHER): Payer: Self-pay | Admitting: Licensed Practical Nurse

## 2021-09-22 ENCOUNTER — Encounter: Payer: Self-pay | Admitting: Licensed Practical Nurse

## 2021-09-22 VITALS — BP 120/80 | Wt 165.0 lb

## 2021-09-22 DIAGNOSIS — Z3A29 29 weeks gestation of pregnancy: Secondary | ICD-10-CM

## 2021-09-22 DIAGNOSIS — Z3480 Encounter for supervision of other normal pregnancy, unspecified trimester: Secondary | ICD-10-CM

## 2021-09-22 NOTE — Progress Notes (Signed)
Routine Prenatal Care Visit  Subjective  Sandra Arellano is a 31 y.o. G2P1001 at [redacted]w[redacted]d being seen today for ongoing prenatal care.  She is currently monitored for the following issues for this low-risk pregnancy and has Herpes and Supervision of other normal pregnancy, antepartum on their problem list.  ----------------------------------------------------------------------------------- Patient reports no complaints.  Has an umbilical hernia that was causing pain during the holidays, feels fine now.  Reviewed options-is not sure if she is done childbearing.  Will use condoms, may consider ParaGard. Prefers to avoid hormones as her milk decreased after starting POP's with last baby. Emotionally dong well, processing what it means to bring another baby into the house.  They are currently in the process of moving.  Contractions: Not present. Vag. Bleeding: None.  Movement: Present. Leaking Fluid denies.  ----------------------------------------------------------------------------------- The following portions of the patient's history were reviewed and updated as appropriate: allergies, current medications, past family history, past medical history, past social history, past surgical history and problem list. Problem list updated.  Objective  Blood pressure 120/80, weight 165 lb (74.8 kg), last menstrual period 03/01/2021, unknown if currently breastfeeding. Pregravid weight 160 lb (72.6 kg) Total Weight Gain 5 lb (2.268 kg) Urinalysis: Urine Protein    Urine Glucose    Fetal Status: Fetal Heart Rate (bpm): 155 Fundal Height: 30 cm Movement: Present     General:  Alert, oriented and cooperative. Patient is in no acute distress.  Skin: Skin is warm and dry. No rash noted.   Cardiovascular: Normal heart rate noted  Respiratory: Normal respiratory effort, no problems with respiration noted  Abdomen: Soft, gravid, appropriate for gestational age. Pain/Pressure: Absent     Pelvic:  Cervical exam deferred         Extremities: Normal range of motion.     Mental Status: Normal mood and affect. Normal behavior. Normal judgment and thought content.   Assessment   31 y.o. G2P1001 at [redacted]w[redacted]d by  12/06/2021, by Last Menstrual Period presenting for routine prenatal visit  Plan   pregnancy 2 Problems (from 05/05/21 to present)     Problem Noted Resolved   Supervision of other normal pregnancy, antepartum 05/05/2021 by Mirna Mires, CNM No   Overview Addendum 09/09/2021 11:52 AM by Mirna Mires, CNM     Nursing Staff Provider  Office Location  [facility]  With an MD  Language  English Anatomy US    Flu Vaccine   Genetic Screen  NIPS: MaternT- xy, negative  TDaP vaccine    Hgb A1C or  GTT Early : Third trimester : 15  Covid    O+  Rhogam   Blood Type   Feeding Plan breast Antibody  neg  Contraception condoms Rubella  immune  Circumcision  RPR   NR  Pediatrician   HBsAg   negative  Support Person Allen  HIV  negative  Prenatal Classes  Varicella immune    GBS  (For PCN allergy, check sensitivities)   BTL Consent     VBAC Consent  Pap      Hgb Electro      CF      SMA                    Preterm labor symptoms and general obstetric precautions including but not limited to vaginal bleeding, contractions, leaking of fluid and fetal movement were reviewed in detail with the patient. Please refer to After Visit Summary for other counseling recommendations.   Return in  about 2 weeks (around 10/06/2021) for ROB.  Carie Caddy, CNM  Domingo Pulse, [facility]  09/22/21  9:18 AM

## 2021-10-06 ENCOUNTER — Ambulatory Visit (INDEPENDENT_AMBULATORY_CARE_PROVIDER_SITE_OTHER): Payer: Self-pay | Admitting: Obstetrics

## 2021-10-06 ENCOUNTER — Other Ambulatory Visit: Payer: Self-pay

## 2021-10-06 VITALS — BP 122/74 | Wt 168.0 lb

## 2021-10-06 DIAGNOSIS — Z3401 Encounter for supervision of normal first pregnancy, first trimester: Secondary | ICD-10-CM

## 2021-10-06 DIAGNOSIS — Z3480 Encounter for supervision of other normal pregnancy, unspecified trimester: Secondary | ICD-10-CM

## 2021-10-06 DIAGNOSIS — Z3A37 37 weeks gestation of pregnancy: Secondary | ICD-10-CM

## 2021-10-06 DIAGNOSIS — Z3A31 31 weeks gestation of pregnancy: Secondary | ICD-10-CM

## 2021-10-06 DIAGNOSIS — B009 Herpesviral infection, unspecified: Secondary | ICD-10-CM

## 2021-10-06 MED ORDER — VALACYCLOVIR HCL 500 MG PO TABS: 500.0000 mg | ORAL_TABLET | Freq: Two times a day (BID) | ORAL | 1 refills | Status: DC

## 2021-10-06 NOTE — Progress Notes (Signed)
Routine Prenatal Care Visit  Subjective  Sandra Arellano is a 31 y.o. G2P1001 at [redacted]w[redacted]d being seen today for ongoing prenatal care.  She is currently monitored for the following issues for this low-risk pregnancy and has Herpes and Supervision of other normal pregnancy, antepartum on their problem list.  ----------------------------------------------------------------------------------- Patient reports no complaints.  She has just moved into a new house. Very busy. 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  .  .   Pincus Large Fluid denies.  ----------------------------------------------------------------------------------- The following portions of the patient's history were reviewed and updated as appropriate: allergies, current medications, past family history, past medical history, past social history, past surgical history and problem list. Problem list updated.  Objective  Blood pressure 122/74, weight 168 lb (76.2 kg), last menstrual period 03/01/2021, unknown if currently breastfeeding. Pregravid weight 160 lb (72.6 kg) Total Weight Gain 8 lb (3.629 kg) Urinalysis: Urine Protein    Urine Glucose    Fetal Status:           General:  Alert, oriented and cooperative. Patient is in no acute distress.  Skin: Skin is warm and dry. No rash noted.   Cardiovascular: Normal heart rate noted  Respiratory: Normal respiratory effort, no problems with respiration noted  Abdomen: Soft, gravid, appropriate for gestational age.       Pelvic:  Cervical exam deferred        Extremities: Normal range of motion.     Mental Status: Normal mood and affect. Normal behavior. Normal judgment and thought content.   Assessment   31 y.o. G2P1001 at [redacted]w[redacted]d by  12/06/2021, by Last Menstrual Period presenting for routine prenatal visit  Plan   pregnancy 2 Problems (from 05/05/21 to present)    Problem Noted Resolved   Supervision of other normal pregnancy, antepartum 05/05/2021 by Mirna Mires, CNM No   Overview Addendum 09/22/2021  9:15 AM by Ellwood Sayers, CNM     Nursing Staff Provider  Office Location  [facility]  With an MD  Language  English Anatomy US  Normal, normal fu   Flu Vaccine   Genetic Screen  NIPS: MaternT- xy, negative  TDaP vaccine    Hgb A1C or  GTT Early : Third trimester : 55  Covid    O+  Rhogam  NA Blood Type   Feeding Plan Breast Antibody  neg  Contraception Condoms  Rubella  immune  Circumcision  RPR   NR  Pediatrician   HBsAg   negative  Support Person Allen  HIV  negative  Prenatal Classes  Varicella immune    GBS  (For PCN allergy, check sensitivities)   BTL Consent     VBAC Consent  Pap      Hgb Electro      CF      SMA                   Preterm labor symptoms and general obstetric precautions including but not limited to vaginal bleeding, contractions, leaking of fluid and fetal movement were reviewed in detail with the patient. Please refer to After Visit Summary for other counseling recommendations.  We reviewed her 28 week labs.  Return in about 2 weeks (around 10/20/2021) for return OB.  Mirna Mires, CNM  10/06/2021 9:34 AM

## 2021-10-06 NOTE — Progress Notes (Signed)
No vb. No lof. Pt has been seeing chiropractor the past few weeks

## 2021-10-19 ENCOUNTER — Other Ambulatory Visit: Payer: Self-pay

## 2021-10-19 ENCOUNTER — Ambulatory Visit (INDEPENDENT_AMBULATORY_CARE_PROVIDER_SITE_OTHER): Payer: 59 | Admitting: Licensed Practical Nurse

## 2021-10-19 VITALS — BP 118/70 | Ht 62.0 in | Wt 168.0 lb

## 2021-10-19 DIAGNOSIS — Z23 Encounter for immunization: Secondary | ICD-10-CM | POA: Diagnosis not present

## 2021-10-19 DIAGNOSIS — Z3480 Encounter for supervision of other normal pregnancy, unspecified trimester: Secondary | ICD-10-CM

## 2021-10-19 DIAGNOSIS — Z3A33 33 weeks gestation of pregnancy: Secondary | ICD-10-CM

## 2021-10-19 LAB — POCT URINALYSIS DIPSTICK OB
Glucose, UA: NEGATIVE
POC,PROTEIN,UA: NEGATIVE

## 2021-10-19 NOTE — Progress Notes (Signed)
Routine Prenatal Care Visit  Subjective  Sandra Arellano is a 31 y.o. G2P1001 at [redacted]w[redacted]d being seen today for ongoing prenatal care.  She is currently monitored for the following issues for this low-risk pregnancy and has Herpes and Supervision of other normal pregnancy, antepartum on their problem list.  ----------------------------------------------------------------------------------- Patient reports no complaints.  Doing well.  Desires tubal ligation, Briefly reveiwed procedure and risks. Some people do have the tubal hours after birth. Rec having next apt with OB to discuss and sign consent.  Contractions: Irregular. Vag. Bleeding: None.  Movement: Present. Leaking Fluid denies.  ----------------------------------------------------------------------------------- The following portions of the patient's history were reviewed and updated as appropriate: allergies, current medications, past family history, past medical history, past social history, past surgical history and problem list. Problem list updated.  Objective  Blood pressure 118/70, height 5\' 2"  (1.575 m), weight 168 lb (76.2 kg), last menstrual period 03/01/2021, unknown if currently breastfeeding. Pregravid weight 160 lb (72.6 kg) Total Weight Gain 8 lb (3.629 kg) Urinalysis: Urine Protein Negative  Urine Glucose Negative  Fetal Status: Fetal Heart Rate (bpm): 130 Fundal Height: 33 cm Movement: Present     General:  Alert, oriented and cooperative. Patient is in no acute distress.  Skin: Skin is warm and dry. No rash noted.   Cardiovascular: Normal heart rate noted  Respiratory: Normal respiratory effort, no problems with respiration noted  Abdomen: Soft, gravid, appropriate for gestational age. Pain/Pressure: Present     Pelvic:  Cervical exam deferred        Extremities: Normal range of motion.  Edema: None  Mental Status: Normal mood and affect. Normal behavior. Normal judgment and thought content.   Assessment   31 y.o.  G2P1001 at [redacted]w[redacted]d by  12/06/2021, by Last Menstrual Period presenting for routine prenatal visit  Plan   pregnancy 2 Problems (from 05/05/21 to present)     Problem Noted Resolved   Supervision of other normal pregnancy, antepartum 05/05/2021 by 05/07/2021, CNM No   Overview Addendum 09/22/2021  9:15 AM by 11/20/2021, CNM     Nursing Staff Provider  Office Location  [facility]  With an MD  Language  English Anatomy Ellwood Sayers  Normal, normal fu   Flu Vaccine   Genetic Screen  NIPS: MaternT- xy, negative  TDaP vaccine    Hgb A1C or  GTT Early : Third trimester : 35  Covid    O+  Rhogam  NA Blood Type   Feeding Plan Breast Antibody  neg  Contraception Condoms  Rubella  immune  Circumcision  RPR   NR  Pediatrician   HBsAg   negative  Support Person Allen  HIV  negative  Prenatal Classes  Varicella immune    GBS  (For PCN allergy, check sensitivities)   BTL Consent     VBAC Consent  Pap      Hgb Electro      CF      SMA                    Preterm labor symptoms and general obstetric precautions including but not limited to vaginal bleeding, contractions, leaking of fluid and fetal movement were reviewed in detail with the patient. Please refer to After Visit Summary for other counseling recommendations.   Return in about 2 weeks (around 11/02/2021) for ROB. 11/04/2021, CNM  Carie Caddy, [facility]  10/19/21  1:13 PM

## 2021-11-04 ENCOUNTER — Ambulatory Visit (INDEPENDENT_AMBULATORY_CARE_PROVIDER_SITE_OTHER): Payer: 59 | Admitting: Advanced Practice Midwife

## 2021-11-04 ENCOUNTER — Other Ambulatory Visit: Payer: Self-pay

## 2021-11-04 ENCOUNTER — Encounter: Payer: Self-pay | Admitting: Advanced Practice Midwife

## 2021-11-04 VITALS — BP 120/80 | Wt 171.0 lb

## 2021-11-04 DIAGNOSIS — Z3A35 35 weeks gestation of pregnancy: Secondary | ICD-10-CM

## 2021-11-04 DIAGNOSIS — Z3480 Encounter for supervision of other normal pregnancy, unspecified trimester: Secondary | ICD-10-CM

## 2021-11-04 LAB — POCT URINALYSIS DIPSTICK OB
Glucose, UA: NEGATIVE
POC,PROTEIN,UA: NEGATIVE

## 2021-11-04 NOTE — Progress Notes (Signed)
Routine Prenatal Care Visit  Subjective  Sandra Arellano is a 31 y.o. G2P1001 at [redacted]w[redacted]d being seen today for ongoing prenatal care.  She is currently monitored for the following issues for this low-risk pregnancy and has Herpes and Supervision of other normal pregnancy, antepartum on their problem list.  ----------------------------------------------------------------------------------- Patient reports no complaints.  Signed btl consent today. Contractions: Not present. Vag. Bleeding: None.  Movement: Present. Leaking Fluid denies.  ----------------------------------------------------------------------------------- The following portions of the patient's history were reviewed and updated as appropriate: allergies, current medications, past family history, past medical history, past social history, past surgical history and problem list. Problem list updated.  Objective  Blood pressure 120/80, weight 171 lb (77.6 kg), last menstrual period 03/01/2021, unknown if currently breastfeeding. Pregravid weight 160 lb (72.6 kg) Total Weight Gain 11 lb (4.99 kg) Urinalysis: Urine Protein    Urine Glucose    Fetal Status: Fetal Heart Rate (bpm): 140 Fundal Height: 35 cm Movement: Present     General:  Alert, oriented and cooperative. Patient is in no acute distress.  Skin: Skin is warm and dry. No rash noted.   Cardiovascular: Normal heart rate noted  Respiratory: Normal respiratory effort, no problems with respiration noted  Abdomen: Soft, gravid, appropriate for gestational age. Pain/Pressure: Present     Pelvic:  Cervical exam deferred        Extremities: Normal range of motion.  Edema: None  Mental Status: Normal mood and affect. Normal behavior. Normal judgment and thought content.   Assessment   31 y.o. G2P1001 at [redacted]w[redacted]d by  12/06/2021, by Last Menstrual Period presenting for routine prenatal visit  Plan   pregnancy 2 Problems (from 05/05/21 to present)    Problem Noted Resolved    Supervision of other normal pregnancy, antepartum 05/05/2021 by Mirna Mires, CNM No   Overview Addendum 09/22/2021  9:15 AM by Ellwood Sayers, CNM     Nursing Staff Provider  Office Location  [facility]ating  With an MD  Language  English Anatomy US  Normal, normal fu   Flu Vaccine   Genetic Screen  NIPS: MaternT- xy, negative  TDaP vaccine    Hgb A1C or  GTT Early : Third trimester : 69  Covid    O+  Rhogam  NA Blood Type   Feeding Plan Breast Antibody  neg  Contraception Condoms  Rubella  immune  Circumcision  RPR   NR  Pediatrician   HBsAg   negative  Support Person Allen  HIV  negative  Prenatal Classes  Varicella immune    GBS  (For PCN allergy, check sensitivities)   BTL Consent Signed 11/04/21    VBAC Consent  Pap      Hgb Electro      CF      SMA                   Preterm labor symptoms and general obstetric precautions including but not limited to vaginal bleeding, contractions, leaking of fluid and fetal movement were reviewed in detail with the patient.   Return in about 1 week (around 11/11/2021) for rob.  Tresea Mall, CNM 11/04/2021 9:52 AM

## 2021-11-04 NOTE — Addendum Note (Signed)
Addended by: Cornelius Moras D on: 11/04/2021 10:00 AM   Modules accepted: Orders

## 2021-11-11 ENCOUNTER — Other Ambulatory Visit: Payer: Self-pay

## 2021-11-11 ENCOUNTER — Ambulatory Visit (INDEPENDENT_AMBULATORY_CARE_PROVIDER_SITE_OTHER): Payer: 59 | Admitting: Obstetrics

## 2021-11-11 VITALS — BP 122/70 | Wt 172.0 lb

## 2021-11-11 DIAGNOSIS — Z3A36 36 weeks gestation of pregnancy: Secondary | ICD-10-CM | POA: Diagnosis not present

## 2021-11-11 DIAGNOSIS — Z3480 Encounter for supervision of other normal pregnancy, unspecified trimester: Secondary | ICD-10-CM

## 2021-11-11 NOTE — Progress Notes (Signed)
Routine Prenatal Care Visit ? ?Subjective  ?Sandra Arellano is a 31 y.o. G2P1001 at [redacted]w[redacted]d being seen today for ongoing prenatal care.  She is currently monitored for the following issues for this low-risk pregnancy and has Herpes and Supervision of other normal pregnancy, antepartum on their problem list.  ?----------------------------------------------------------------------------------- ?Patient reports no complaints.   ?Contractions: Not present. Vag. Bleeding: None.   . Leaking Fluid denies.  ?----------------------------------------------------------------------------------- ?The following portions of the patient's history were reviewed and updated as appropriate: allergies, current medications, past family history, past medical history, past social history, past surgical history and problem list. Problem list updated. ? ?Objective  ?Blood pressure 122/70, weight 172 lb (78 kg), last menstrual period 03/01/2021, unknown if currently breastfeeding. ?Pregravid weight 160 lb (72.6 kg) Total Weight Gain 12 lb (5.443 kg) ?Urinalysis: Urine Protein    Urine Glucose   ? ?Fetal Status:          ? ?General:  Alert, oriented and cooperative. Patient is in no acute distress.  ?Skin: Skin is warm and dry. No rash noted.   ?Cardiovascular: Normal heart rate noted  ?Respiratory: Normal respiratory effort, no problems with respiration noted  ?Abdomen: Soft, gravid, appropriate for gestational age. Pain/Pressure: Absent     ?Pelvic:  Cervical exam performed        ?Extremities: Normal range of motion.     ?Mental Status: Normal mood and affect. Normal behavior. Normal judgment and thought content.  ? ?Assessment  ? ?31 y.o. G2P1001 at [redacted]w[redacted]d by  12/06/2021, by Last Menstrual Period presenting for routine prenatal visit ? ?Plan  ? ?pregnancy 2 Problems (from 05/05/21 to present)   ? Problem Noted Resolved  ? Supervision of other normal pregnancy, antepartum 05/05/2021 by Imagene Riches, CNM No  ? Overview Addendum 11/04/2021   9:55 AM by Rod Can, CNM  ?   ?Nursing Staff Provider  ?Office Location  [facility]  With an MD  ?Language  English Anatomy US  Normal, normal fu   ?Flu Vaccine   Genetic Screen  NIPS: MaternT- xy, negative  ?TDaP vaccine    Hgb A1C or  ?GTT Early : ?Third trimester : 66  ?Covid    O+  ?Rhogam  NA Blood Type   ?Feeding Plan Breast Antibody  neg  ?Contraception Condoms/BTL  Rubella  immune  ?Circumcision  RPR   NR  ?Pediatrician   HBsAg   negative  ?Support Person Zenia Resides  HIV  negative  ?Prenatal Classes  Varicella immune  ?  GBS  (For PCN allergy, check sensitivities)   ?BTL Consent Signed 11/04/21    ?VBAC Consent  Pap    ?  Hgb Electro    ?  CF   ?   SMA   ?     ? ? ?  ?  ?  ?  ? ?Preterm labor symptoms and general obstetric precautions including but not limited to vaginal bleeding, contractions, leaking of fluid and fetal movement were reviewed in detail with the patient. ?Please refer to After Visit Summary for other counseling recommendations.  ?GBS and cultures today. ? ?Return in about 1 week (around 11/18/2021) for return OB. ? ?Imagene Riches, CNM  ?11/11/2021 11:19 AM   ? ?

## 2021-11-11 NOTE — Progress Notes (Signed)
No vb. No lof. GBS today.  

## 2021-11-14 LAB — GC/CHLAMYDIA PROBE AMP
Chlamydia trachomatis, NAA: NEGATIVE
Neisseria Gonorrhoeae by PCR: NEGATIVE

## 2021-11-16 ENCOUNTER — Other Ambulatory Visit: Payer: Self-pay

## 2021-11-16 ENCOUNTER — Ambulatory Visit (INDEPENDENT_AMBULATORY_CARE_PROVIDER_SITE_OTHER): Payer: 59 | Admitting: Obstetrics

## 2021-11-16 VITALS — BP 120/74 | Wt 175.0 lb

## 2021-11-16 DIAGNOSIS — Z3A37 37 weeks gestation of pregnancy: Secondary | ICD-10-CM

## 2021-11-16 DIAGNOSIS — Z3480 Encounter for supervision of other normal pregnancy, unspecified trimester: Secondary | ICD-10-CM

## 2021-11-16 LAB — CULTURE, BETA STREP (GROUP B ONLY): Strep Gp B Culture: NEGATIVE

## 2021-11-16 NOTE — Progress Notes (Signed)
No vb. No lof.  

## 2021-11-16 NOTE — Progress Notes (Signed)
Routine Prenatal Care Visit ? ?Subjective  ?Sandra Arellano is a 31 y.o. G2P1001 at [redacted]w[redacted]d being seen today for ongoing prenatal care.  She is currently monitored for the following issues for this low risk pregnancy and has Herpes and Supervision of other normal pregnancy, antepartum on their problem list.  ?----------------------------------------------------------------------------------- ?Patient reports constipation and restless leg symptoms at night, feeling ready for labor.   ?Contractions: Irritability. Vag. Bleeding: None.  Movement: Present. Leaking Fluid: denies.  ?----------------------------------------------------------------------------------- ?The following portions of the patient's history were reviewed and updated as appropriate: allergies, current medications, past family history, past medical history, past social history, past surgical history and problem list. Problem list updated. ? ?Objective  ?Blood pressure 120/74, weight 175 lb (79.4 kg), last menstrual period 03/01/2021, unknown if currently breastfeeding. ?Pregravid weight 160 lb (72.6 kg) Total Weight Gain 15 lb (6.804 kg) ?Urinalysis: Urine Protein    Urine Glucose   ? ?Fetal Status:     Movement: Present    ? ?General:  Alert, oriented and cooperative. Patient is in no acute distress.  ?Skin: Skin is warm and dry. No rash noted.   ?Cardiovascular: Normal heart rate noted  ?Respiratory: Normal respiratory effort, no problems with respiration noted  ?Abdomen: Soft, gravid, appropriate for gestational age. Pain/Pressure: Present     ?Pelvic:  Deferred        ?Extremities: Normal range of motion.     ?Mental Status: Normal mood and affect. Normal behavior. Normal judgment and thought content.  ? ?Assessment  ? ?31 y.o. G2P1001 at [redacted]w[redacted]d by  12/06/2021, by Last Menstrual Period presenting for routine prenatal visit ? ?Plan  ? ?pregnancy 2 Problems (from 05/05/21 to present)   ? Problem Noted Resolved  ? Supervision of other normal pregnancy,  antepartum 05/05/2021 by Imagene Riches, CNM No  ? Overview Addendum 11/16/2021  9:30 AM by Imagene Riches, CNM  ?   ?Nursing Staff Provider  ?Office Location  [facility]  With an MD  ?Language  English Anatomy US  Normal, normal fu   ?Flu Vaccine   Genetic Screen  NIPS: MaternT- xy, negative  ?TDaP vaccine    Hgb A1C or  ?GTT Early : ?Third trimester : 14  ?Covid    O+  ?Rhogam  NA Blood Type   ?Feeding Plan Breast Antibody Negative (09/08 1304)neg  ?Contraception Condoms/BTL  Rubella 1.67 (09/08 1304)immune  ?Circumcision  RPR Non Reactive (12/29 0949) NR  ?Pediatrician   HBsAg Negative (09/08 1304) negative  ?Support Person Zenia Resides  HIV Non Reactive (12/29 0949)negative  ?Prenatal Classes  Varicella immune  ?  GBS Negative/-- (03/03 1129)(For PCN allergy, check sensitivities)   ?BTL Consent Signed 11/04/21    ?VBAC Consent  Pap    ?  Hgb Electro    ?  CF   ?   SMA   ?     ? ? ?  ?  ?  ?  ? ?Term labor symptoms and general obstetric precautions including but not limited to vaginal bleeding, contractions, leaking of fluid and fetal movement were reviewed in detail with the patient. ?Please refer to After Visit Summary for other counseling recommendations. Counseled on labor signs and symptoms, pain management during labor. Patient continues to desire BTL for contraception after birth. Counseled on stool softeners for constipation and magnesium for symptoms of restless legs.  ? ?Return in about 1 week (around 11/23/2021) for return OB. ? ?Imagene Riches, CNM  ?11/16/2021 9:30 AM   ? ?

## 2021-11-24 ENCOUNTER — Ambulatory Visit (INDEPENDENT_AMBULATORY_CARE_PROVIDER_SITE_OTHER): Payer: 59 | Admitting: Obstetrics

## 2021-11-24 ENCOUNTER — Other Ambulatory Visit: Payer: Self-pay

## 2021-11-24 VITALS — BP 100/60 | Wt 175.0 lb

## 2021-11-24 DIAGNOSIS — Z3480 Encounter for supervision of other normal pregnancy, unspecified trimester: Secondary | ICD-10-CM

## 2021-11-24 DIAGNOSIS — Z3A38 38 weeks gestation of pregnancy: Secondary | ICD-10-CM

## 2021-11-24 NOTE — Progress Notes (Signed)
Routine Prenatal Care Visit ? ?Subjective  ?Sandra Arellano is a 31 y.o. G2P1001 at [redacted]w[redacted]d being seen today for ongoing prenatal care.  She is currently monitored for the following issues for this low-risk pregnancy and has Herpes and Supervision of other normal pregnancy, antepartum on their problem list.  ?----------------------------------------------------------------------------------- ?Patient reports backache.   ? .  .   . Leaking Fluid denies.  ?----------------------------------------------------------------------------------- ?The following portions of the patient's history were reviewed and updated as appropriate: allergies, current medications, past family history, past medical history, past social history, past surgical history and problem list. Problem list updated. ? ?Objective  ?Blood pressure 100/60, weight 175 lb (79.4 kg), last menstrual period 03/01/2021, unknown if currently breastfeeding. ?Pregravid weight 160 lb (72.6 kg) Total Weight Gain 15 lb (6.804 kg) ?Urinalysis: Urine Protein    Urine Glucose   ? ?Fetal Status:          ? ?General:  Alert, oriented and cooperative. Patient is in no acute distress.  ?Skin: Skin is warm and dry. No rash noted.   ?Cardiovascular: Normal heart rate noted  ?Respiratory: Normal respiratory effort, no problems with respiration noted  ?Abdomen: Soft, gravid, appropriate for gestational age.       ?Pelvic:  2/50/-3        ?Extremities: Normal range of motion.     ?Mental Status: Normal mood and affect. Normal behavior. Normal judgment and thought content.  ? ?Assessment  ? ?31 y.o. G2P1001 at [redacted]w[redacted]d by  12/06/2021, by Last Menstrual Period presenting for routine prenatal visit ? ?Plan  ? ?pregnancy 2 Problems (from 05/05/21 to present)   ? Problem Noted Resolved  ? Supervision of other normal pregnancy, antepartum 05/05/2021 by Mirna Mires, CNM No  ? Overview Addendum 11/16/2021  9:30 AM by Mirna Mires, CNM  ?   ?Nursing Staff Provider  ?Office Location   [facility]  With an MD  ?Language  English Anatomy US  Normal, normal fu   ?Flu Vaccine   Genetic Screen  NIPS: MaternT- xy, negative  ?TDaP vaccine    Hgb A1C or  ?GTT Early : ?Third trimester : 23  ?Covid    O+  ?Rhogam  NA Blood Type   ?Feeding Plan Breast Antibody Negative (09/08 1304)neg  ?Contraception Condoms/BTL  Rubella 1.67 (09/08 1304)immune  ?Circumcision  RPR Non Reactive (12/29 0949) NR  ?Pediatrician   HBsAg Negative (09/08 1304) negative  ?Support Person Freida Busman  HIV Non Reactive (12/29 0949)negative  ?Prenatal Classes  Varicella immune  ?  GBS Negative/-- (03/03 1129)(For PCN allergy, check sensitivities)   ?BTL Consent Signed 11/04/21    ?VBAC Consent  Pap    ?  Hgb Electro    ?  CF   ?   SMA   ?     ? ? ?  ?  ?  ?  ? ?Term labor symptoms and general obstetric precautions including but not limited to vaginal bleeding, contractions, leaking of fluid and fetal movement were reviewed in detail with the patient. ?Please refer to After Visit Summary for other counseling recommendations.  ? ?No follow-ups on file.  Patient has appointment next week.   ? ?Lamont Snowball, SNM ?Mirna Mires, CNM  ?11/24/2021 11:44 AM   ? ?

## 2021-11-24 NOTE — Progress Notes (Signed)
ROB- cervix check, no concerns 

## 2021-11-30 DIAGNOSIS — H1031 Unspecified acute conjunctivitis, right eye: Secondary | ICD-10-CM | POA: Diagnosis not present

## 2021-11-30 DIAGNOSIS — J0140 Acute pansinusitis, unspecified: Secondary | ICD-10-CM | POA: Diagnosis not present

## 2021-11-30 DIAGNOSIS — J019 Acute sinusitis, unspecified: Secondary | ICD-10-CM | POA: Diagnosis not present

## 2021-12-02 ENCOUNTER — Ambulatory Visit (INDEPENDENT_AMBULATORY_CARE_PROVIDER_SITE_OTHER): Payer: 59 | Admitting: Licensed Practical Nurse

## 2021-12-02 ENCOUNTER — Other Ambulatory Visit: Payer: Self-pay

## 2021-12-02 ENCOUNTER — Encounter: Payer: Self-pay | Admitting: Licensed Practical Nurse

## 2021-12-02 VITALS — BP 120/62 | Wt 179.0 lb

## 2021-12-02 DIAGNOSIS — Z3A39 39 weeks gestation of pregnancy: Secondary | ICD-10-CM

## 2021-12-02 DIAGNOSIS — Z3403 Encounter for supervision of normal first pregnancy, third trimester: Secondary | ICD-10-CM

## 2021-12-02 DIAGNOSIS — Z3400 Encounter for supervision of normal first pregnancy, unspecified trimester: Secondary | ICD-10-CM

## 2021-12-02 NOTE — Progress Notes (Signed)
Routine Prenatal Care Visit ? ?Subjective  ?Sandra Arellano is a 31 y.o. G2P1001 at [redacted]w[redacted]d being seen today for ongoing prenatal care.  She is currently monitored for the following issues for this low-risk pregnancy and has Herpes and Supervision of other normal pregnancy, antepartum on their problem list.  ?----------------------------------------------------------------------------------- ?Patient reports no complaints.  Here with partner. Doing well but starting to feel uncomfortable, desires to schedule IOL. Desires sweep today, VE 3/50/-1, swept.  ?Contractions: Irritability. Vag. Bleeding: None.  Movement: Present. Leaking Fluid denies.  ?----------------------------------------------------------------------------------- ?The following portions of the patient's history were reviewed and updated as appropriate: allergies, current medications, past family history, past medical history, past social history, past surgical history and problem list. Problem list updated. ? ?Objective  ?Blood pressure 120/62, weight 179 lb (81.2 kg), last menstrual period 03/01/2021, unknown if currently breastfeeding. ?Pregravid weight 160 lb (72.6 kg) Total Weight Gain 19 lb (8.618 kg) ?Urinalysis: Urine Protein    Urine Glucose   ? ?Fetal Status: Fetal Heart Rate (bpm): 150 Fundal Height: 39 cm Movement: Present  Presentation: Vertex ? ?General:  Alert, oriented and cooperative. Patient is in no acute distress.  ?Skin: Skin is warm and dry. No rash noted.   ?Cardiovascular: Normal heart rate noted  ?Respiratory: Normal respiratory effort, no problems with respiration noted  ?Abdomen: Soft, gravid, appropriate for gestational age. Pain/Pressure: Present     ?Pelvic:  Cervical exam performed Dilation: 3 Effacement (%): 50 Station: -1  ?Extremities: Normal range of motion.  Edema: None  ?Mental Status: Normal mood and affect. Normal behavior. Normal judgment and thought content.  ? ?Assessment  ? ?31 y.o. G2P1001 at [redacted]w[redacted]d by   12/06/2021, by Last Menstrual Period presenting for routine prenatal visit ? ?Plan  ? ?pregnancy 2 Problems (from 05/05/21 to present)   ? ? Problem Noted Resolved  ? Supervision of other normal pregnancy, antepartum 05/05/2021 by Mirna Mires, CNM No  ? Overview Addendum 11/16/2021  9:30 AM by Mirna Mires, CNM  ?   ?Nursing Staff Provider  ?Office Location  [facility]  With an MD  ?Language  English Anatomy US  Normal, normal fu   ?Flu Vaccine   Genetic Screen  NIPS: MaternT- xy, negative  ?TDaP vaccine    Hgb A1C or  ?GTT Early : ?Third trimester : 83  ?Covid    O+  ?Rhogam  NA Blood Type   ?Feeding Plan Breast Antibody Negative (09/08 1304)neg  ?Contraception Condoms/BTL  Rubella 1.67 (09/08 1304)immune  ?Circumcision  RPR Non Reactive (12/29 0949) NR  ?Pediatrician   HBsAg Negative (09/08 1304) negative  ?Support Person Freida Busman  HIV Non Reactive (12/29 0949)negative  ?Prenatal Classes  Varicella immune  ?  GBS Negative/-- (03/03 1129)(For PCN allergy, check sensitivities)   ?BTL Consent Signed 11/04/21    ?VBAC Consent  Pap    ?  Hgb Electro    ?  CF   ?   SMA   ?     ? ? ?  ?  ? ?  ?  ? ?Term labor symptoms and general obstetric precautions including but not limited to vaginal bleeding, contractions, leaking of fluid and fetal movement were reviewed in detail with the patient. ?Please refer to After Visit Summary for other counseling recommendations.  ? ?Return for ROB, Mon or Tuesday . ? ?IOL April 1 at 0800 ?Will return early next week for ROB/sweep  ? ?Carie Caddy, CNM  ?Domingo Pulse, [facility]  ?12/02/21  ?1:36  PM  ? ? ?

## 2021-12-06 ENCOUNTER — Inpatient Hospital Stay: Payer: 59 | Admitting: Anesthesiology

## 2021-12-06 ENCOUNTER — Other Ambulatory Visit: Payer: Self-pay

## 2021-12-06 ENCOUNTER — Encounter: Payer: Self-pay | Admitting: Advanced Practice Midwife

## 2021-12-06 ENCOUNTER — Encounter: Payer: Self-pay | Admitting: Obstetrics and Gynecology

## 2021-12-06 ENCOUNTER — Inpatient Hospital Stay
Admission: EM | Admit: 2021-12-06 | Discharge: 2021-12-07 | DRG: 807 | Disposition: A | Discharge status: Home / Self Care | Payer: 59 | Attending: Obstetrics | Admitting: Obstetrics

## 2021-12-06 ENCOUNTER — Ambulatory Visit (INDEPENDENT_AMBULATORY_CARE_PROVIDER_SITE_OTHER): Payer: 59 | Admitting: Advanced Practice Midwife

## 2021-12-06 VITALS — BP 122/70 | Wt 178.0 lb

## 2021-12-06 DIAGNOSIS — O480 Post-term pregnancy: Secondary | ICD-10-CM | POA: Diagnosis not present

## 2021-12-06 DIAGNOSIS — Z3490 Encounter for supervision of normal pregnancy, unspecified, unspecified trimester: Secondary | ICD-10-CM | POA: Diagnosis present

## 2021-12-06 DIAGNOSIS — Z3A40 40 weeks gestation of pregnancy: Secondary | ICD-10-CM | POA: Diagnosis not present

## 2021-12-06 DIAGNOSIS — Z3480 Encounter for supervision of other normal pregnancy, unspecified trimester: Secondary | ICD-10-CM

## 2021-12-06 DIAGNOSIS — Z412 Encounter for routine and ritual male circumcision: Secondary | ICD-10-CM | POA: Diagnosis not present

## 2021-12-06 LAB — CBC
HCT: 40.9 % (ref 36.0–46.0)
Hemoglobin: 13.9 g/dL (ref 12.0–15.0)
MCH: 30.7 pg (ref 26.0–34.0)
MCHC: 34 g/dL (ref 30.0–36.0)
MCV: 90.3 fL (ref 80.0–100.0)
Platelets: 200 10*3/uL (ref 150–400)
RBC: 4.53 MIL/uL (ref 3.87–5.11)
RDW: 12.7 % (ref 11.5–15.5)
WBC: 13.7 10*3/uL — ABNORMAL HIGH (ref 4.0–10.5)
nRBC: 0 % (ref 0.0–0.2)

## 2021-12-06 LAB — TYPE AND SCREEN
ABO/RH(D): O POS
Antibody Screen: NEGATIVE

## 2021-12-06 MED ORDER — DIBUCAINE (PERIANAL) 1 % EX OINT: 1.0000 "application " | TOPICAL_OINTMENT | CUTANEOUS | Status: DC | PRN

## 2021-12-06 MED ORDER — BUPIVACAINE HCL (PF) 0.25 % IJ SOLN
INTRAMUSCULAR | Status: DC | PRN
  Administered 2021-12-06: 4 mL via EPIDURAL
  Administered 2021-12-06: 2 mL via EPIDURAL

## 2021-12-06 MED ORDER — OXYTOCIN BOLUS FROM INFUSION
333.0000 mL | Freq: Once | INTRAVENOUS | Status: AC
  Administered 2021-12-06: 333 mL via INTRAVENOUS

## 2021-12-06 MED ORDER — SOD CITRATE-CITRIC ACID 500-334 MG/5ML PO SOLN: 30.0000 mL | ORAL | Status: DC | PRN

## 2021-12-06 MED ORDER — IBUPROFEN 600 MG PO TABS
600.0000 mg | ORAL_TABLET | Freq: Four times a day (QID) | ORAL | Status: DC
  Administered 2021-12-06 – 2021-12-07 (×4): 600 mg via ORAL
  Filled 2021-12-06 (×4): qty 1

## 2021-12-06 MED ORDER — LIDOCAINE HCL (PF) 1 % IJ SOLN
30.0000 mL | INTRAMUSCULAR | Status: DC | PRN
  Filled 2021-12-06: qty 30

## 2021-12-06 MED ORDER — BENZOCAINE-MENTHOL 20-0.5 % EX AERO
1.0000 "application " | INHALATION_SPRAY | CUTANEOUS | Status: DC | PRN
  Administered 2021-12-06: 1 via TOPICAL
  Filled 2021-12-06: qty 56

## 2021-12-06 MED ORDER — METHYLERGONOVINE MALEATE 0.2 MG/ML IJ SOLN
INTRAMUSCULAR | Status: AC
  Filled 2021-12-06: qty 1

## 2021-12-06 MED ORDER — LACTATED RINGERS IV SOLN: 500.0000 mL | INTRAVENOUS | Status: DC | PRN

## 2021-12-06 MED ORDER — MISOPROSTOL 25 MCG QUARTER TABLET: 25.0000 ug | ORAL_TABLET | ORAL | Status: DC | PRN

## 2021-12-06 MED ORDER — OXYTOCIN-SODIUM CHLORIDE 30-0.9 UT/500ML-% IV SOLN
1.0000 m[IU]/min | INTRAVENOUS | Status: DC
  Administered 2021-12-06: 2 m[IU]/min via INTRAVENOUS

## 2021-12-06 MED ORDER — PRENATAL MULTIVITAMIN CH
1.0000 | ORAL_TABLET | Freq: Every day | ORAL | Status: DC
  Administered 2021-12-07: 1 via ORAL
  Filled 2021-12-06: qty 1

## 2021-12-06 MED ORDER — ONDANSETRON HCL 4 MG/2ML IJ SOLN: 4.0000 mg | INTRAMUSCULAR | Status: DC | PRN

## 2021-12-06 MED ORDER — MISOPROSTOL 200 MCG PO TABS
ORAL_TABLET | ORAL | Status: AC
  Filled 2021-12-06: qty 4

## 2021-12-06 MED ORDER — LACTATED RINGERS IV SOLN
500.0000 mL | Freq: Once | INTRAVENOUS | Status: AC
  Administered 2021-12-06: 500 mL via INTRAVENOUS

## 2021-12-06 MED ORDER — PHENYLEPHRINE 40 MCG/ML (10ML) SYRINGE FOR IV PUSH (FOR BLOOD PRESSURE SUPPORT): 80.0000 ug | PREFILLED_SYRINGE | INTRAVENOUS | Status: DC | PRN

## 2021-12-06 MED ORDER — ACETAMINOPHEN 500 MG PO TABS
1000.0000 mg | ORAL_TABLET | Freq: Four times a day (QID) | ORAL | Status: DC
  Administered 2021-12-07 (×2): 1000 mg via ORAL
  Filled 2021-12-06 (×2): qty 2

## 2021-12-06 MED ORDER — METHYLERGONOVINE MALEATE 0.2 MG/ML IJ SOLN
0.2000 mg | Freq: Once | INTRAMUSCULAR | Status: AC
  Administered 2021-12-06: 0.2 mg via INTRAMUSCULAR

## 2021-12-06 MED ORDER — ONDANSETRON HCL 4 MG PO TABS: 4.0000 mg | ORAL_TABLET | ORAL | Status: DC | PRN

## 2021-12-06 MED ORDER — DIPHENHYDRAMINE HCL 25 MG PO CAPS: 25.0000 mg | ORAL_CAPSULE | Freq: Four times a day (QID) | ORAL | Status: DC | PRN

## 2021-12-06 MED ORDER — VALACYCLOVIR HCL 500 MG PO TABS
500.0000 mg | ORAL_TABLET | Freq: Two times a day (BID) | ORAL | Status: DC
  Administered 2021-12-06 – 2021-12-07 (×2): 500 mg via ORAL
  Filled 2021-12-06 (×3): qty 1

## 2021-12-06 MED ORDER — EPHEDRINE 5 MG/ML INJ: 10.0000 mg | INTRAVENOUS | Status: DC | PRN

## 2021-12-06 MED ORDER — OXYTOCIN 10 UNIT/ML IJ SOLN
INTRAMUSCULAR | Status: AC
  Filled 2021-12-06: qty 2

## 2021-12-06 MED ORDER — TERBUTALINE SULFATE 1 MG/ML IJ SOLN
0.2500 mg | Freq: Once | INTRAMUSCULAR | Status: DC | PRN
Start: 2021-12-06 — End: 2021-12-06

## 2021-12-06 MED ORDER — WITCH HAZEL-GLYCERIN EX PADS
1.0000 "application " | MEDICATED_PAD | CUTANEOUS | Status: DC | PRN
  Administered 2021-12-06: 1 via TOPICAL
  Filled 2021-12-06: qty 100

## 2021-12-06 MED ORDER — DIPHENHYDRAMINE HCL 50 MG/ML IJ SOLN: 12.5000 mg | INTRAMUSCULAR | Status: DC | PRN

## 2021-12-06 MED ORDER — OXYTOCIN-SODIUM CHLORIDE 30-0.9 UT/500ML-% IV SOLN
2.5000 [IU]/h | INTRAVENOUS | Status: DC
  Filled 2021-12-06: qty 500

## 2021-12-06 MED ORDER — AMMONIA AROMATIC IN INHA
RESPIRATORY_TRACT | Status: AC
  Filled 2021-12-06: qty 10

## 2021-12-06 MED ORDER — ONDANSETRON HCL 4 MG/2ML IJ SOLN: 4.0000 mg | Freq: Four times a day (QID) | INTRAMUSCULAR | Status: DC | PRN

## 2021-12-06 MED ORDER — FENTANYL-BUPIVACAINE-NACL 0.5-0.125-0.9 MG/250ML-% EP SOLN
12.0000 mL/h | EPIDURAL | Status: DC | PRN
  Administered 2021-12-06: 12 mL/h via EPIDURAL
  Filled 2021-12-06: qty 250

## 2021-12-06 MED ORDER — ZOLPIDEM TARTRATE 5 MG PO TABS: 5.0000 mg | ORAL_TABLET | Freq: Every evening | ORAL | Status: DC | PRN

## 2021-12-06 MED ORDER — BUTORPHANOL TARTRATE 1 MG/ML IJ SOLN: 1.0000 mg | INTRAMUSCULAR | Status: DC | PRN

## 2021-12-06 MED ORDER — COCONUT OIL OIL: 1.0000 "application " | TOPICAL_OIL | Status: DC | PRN

## 2021-12-06 MED ORDER — LACTATED RINGERS IV SOLN
INTRAVENOUS | Status: DC
Start: 2021-12-06 — End: 2021-12-06

## 2021-12-06 MED ORDER — LIDOCAINE-EPINEPHRINE (PF) 1.5 %-1:200000 IJ SOLN
INTRAMUSCULAR | Status: DC | PRN
  Administered 2021-12-06: 3 mL via PERINEURAL

## 2021-12-06 MED ORDER — SIMETHICONE 80 MG PO CHEW: 80.0000 mg | CHEWABLE_TABLET | ORAL | Status: DC | PRN

## 2021-12-06 MED ORDER — DOCUSATE SODIUM 100 MG PO CAPS
100.0000 mg | ORAL_CAPSULE | Freq: Two times a day (BID) | ORAL | Status: DC
  Administered 2021-12-06 – 2021-12-07 (×2): 100 mg via ORAL
  Filled 2021-12-06 (×2): qty 1

## 2021-12-06 NOTE — Anesthesia Procedure Notes (Addendum)
Epidural ?Patient location during procedure: OB ?Start time: 12/06/2021 4:25 PM ?End time: 12/06/2021 4:44 PM ? ?Staffing ?Anesthesiologist: Lenard Simmer, MD ?Resident/CRNA: Elmarie Mainland, CRNA ?Performed: resident/CRNA  ? ?Preanesthetic Checklist ?Completed: patient identified, IV checked, site marked, risks and benefits discussed, surgical consent, monitors and equipment checked, pre-op evaluation and timeout performed ? ?Epidural ?Patient position: sitting ?Prep: ChloraPrep ?Patient monitoring: heart rate, continuous pulse ox and blood pressure ?Approach: midline ?Location: L3-L4 ?Injection technique: LOR saline ? ?Needle:  ?Needle type: Tuohy  ?Needle gauge: 17 G ?Needle length: 9 cm and 9 ?Needle insertion depth: 6 cm ?Catheter type: closed end flexible ?Catheter size: 19 Gauge ?Catheter at skin depth: 11 cm ?Test dose: negative and 1.5% lidocaine with Epi 1:200 K ? ?Assessment ?Sensory level: T10 ?Events: blood not aspirated, injection not painful, no injection resistance, no paresthesia and negative IV test ? ?Additional Notes ?1 attempt ?Pt. Evaluated and documentation done after procedure finished. ?Patient identified. Risks/Benefits/Options discussed with patient including but not limited to bleeding, infection, nerve damage, paralysis, failed block, incomplete pain control, headache, blood pressure changes, nausea, vomiting, reactions to medication both or allergic, itching and postpartum back pain. Confirmed with bedside nurse the patient's most recent platelet count. Confirmed with patient that they are not currently taking any anticoagulation, have any bleeding history or any family history of bleeding disorders. Patient expressed understanding and wished to proceed. All questions were answered. Sterile technique was used throughout the entire procedure. Please see nursing notes for vital signs. Test dose was given through epidural catheter and negative prior to continuing to dose epidural or start  infusion. Warning signs of high block given to the patient including shortness of breath, tingling/numbness in hands, complete motor block, or any concerning symptoms with instructions to call for help. Patient was given instructions on fall risk and not to get out of bed. All questions and concerns addressed with instructions to call with any issues or inadequate analgesia.?  ? ?Patient tolerated the insertion well without immediate complications.Reason for block:procedure for pain ? ? ? ?

## 2021-12-06 NOTE — H&P (Signed)
Sandra Arellano is an 31 y.o. female.   ?Chief Complaint: Uterine contractions ?HPI: Patient presented to L&D with complaints of contractions. Earlier today she was found to be 6 cm in office.  ?pregnancy 2 Problems (from 05/05/21 to present)   ? ? Problem Noted Resolved  ? Supervision of other normal pregnancy, antepartum 05/05/2021 by Imagene Riches, CNM No  ? Overview Addendum 11/16/2021  9:30 AM by Imagene Riches, CNM  ?   ?Nursing Staff Provider  ?Office Location  [facility]  With an MD  ?Language  English Anatomy US  Normal  ?Flu Vaccine   Genetic Screen  NIPS: MaternT- xy, negative  ?TDaP vaccine   10/19/2021 Hgb A1C or  ?GTT Early : ?Third trimester : 43  ?Covid      ?Rhogam  NA Blood Type O+  ?Feeding Plan Breast Antibody Negative (09/08 1304)neg  ?Contraception Condoms/BTL  Rubella 1.67 (09/08 1304)immune  ?Circumcision  RPR Non Reactive (12/29 0949) NR  ?Pediatrician   HBsAg Negative (09/08 1304) negative  ?Support Person Zenia Resides  HIV Non Reactive (12/29 0949)negative  ?Prenatal Classes  Varicella Immune  ?  GBS Negative/-- (03/03 1129)(For PCN allergy, check sensitivities)   ?BTL Consent Signed 11/04/21    ?VBAC Consent  Pap  07/2019- NIL  ?  Hgb Electro    ?  CF   ?   SMA   ?     ? ?  ?  ? ?  ? ? ? ?Past Medical History:  ?Diagnosis Date  ? Acne   ? HSV-2 (herpes simplex virus 2) infection   ? ? ?Past Surgical History:  ?Procedure Laterality Date  ? NO PAST SURGERIES    ? ? ?Family History  ?Problem Relation Age of Onset  ? Diabetes Maternal Grandmother   ? Heart disease Maternal Grandmother   ? Diabetes Maternal Grandfather   ? COPD Maternal Grandfather   ? Breast cancer Paternal Grandmother   ? Diabetes Paternal Grandmother   ? Diabetes Paternal Grandfather   ? Kidney disease Paternal Grandfather   ? Hypertension Father   ? Ovarian cancer Neg Hx   ? Colon cancer Neg Hx   ? ?Social History:  reports that she has never smoked. She has never used smokeless tobacco. She reports that she does not  currently use alcohol. She reports that she does not use drugs. ? ?Allergies: No Known Allergies ? ?Medications Prior to Admission  ?Medication Sig Dispense Refill  ? budesonide (RHINOCORT AQUA) 32 MCG/ACT nasal spray Place into the nose.    ? MAGNESIUM PO Take by mouth.    ? Prenatal MV & Min w/FA-DHA ([facility] PRENATAL GUMMY PO) Take by mouth.    ? valACYclovir (VALTREX) 500 MG tablet Take 1 tablet (500 mg total) by mouth 2 (two) times daily. 180 tablet 1  ? ? ?Results for orders placed or performed during the hospital encounter of 12/06/21 (from the past 48 hour(s))  ?CBC     Status: Abnormal  ? Collection Time: 12/06/21  2:03 PM  ?Result Value Ref Range  ? WBC 13.7 (H) 4.0 - 10.5 K/uL  ? RBC 4.53 3.87 - 5.11 MIL/uL  ? Hemoglobin 13.9 12.0 - 15.0 g/dL  ? HCT 40.9 36.0 - 46.0 %  ? MCV 90.3 80.0 - 100.0 fL  ? MCH 30.7 26.0 - 34.0 pg  ? MCHC 34.0 30.0 - 36.0 g/dL  ? RDW 12.7 11.5 - 15.5 %  ? Platelets 200 150 - 400 K/uL  ?  nRBC 0.0 0.0 - 0.2 %  ?  Comment: Performed at [facility], [address], [place], [place] [address]  ?Type and screen     Status: None  ? Collection Time: 12/06/21  2:03 PM  ?Result Value Ref Range  ? ABO/RH(D) O POS   ? Antibody Screen NEG   ? Sample Expiration    ?  12/09/2021,2359 ?Performed at [facility], [address], [place], [place] [address] ?  ? ?No results found. ? ?Review of Systems  ?Constitutional:  Negative for chills and fever.  ?HENT:  Negative for congestion, hearing loss and sinus pain.   ?Respiratory:  Negative for cough, shortness of breath and wheezing.   ?Cardiovascular:  Negative for chest pain, palpitations and leg swelling.  ?Gastrointestinal:  Negative for abdominal pain, constipation, diarrhea, nausea and vomiting.  ?Genitourinary:  Negative for dysuria, flank pain, frequency, hematuria and urgency.  ?Musculoskeletal:  Negative for back pain.  ?Skin:  Negative for rash.  ?Neurological:  Negative for dizziness and headaches.   ?Psychiatric/Behavioral:  Negative for suicidal ideas. The patient is not nervous/anxious.   ? ?Blood pressure 115/89, pulse (!) 101, temperature 98.8 ?F (37.1 ?C), temperature source Oral, resp. rate 18, height 5\' 2"  (1.575 m), last menstrual period 03/01/2021, unknown if currently breastfeeding. ?Physical Exam ?Vitals and nursing note reviewed.  ?Constitutional:   ?   Appearance: Normal appearance. She is well-developed.  ?HENT:  ?   Head: Normocephalic and atraumatic.  ?Cardiovascular:  ?   Rate and Rhythm: Normal rate and regular rhythm.  ?Pulmonary:  ?   Effort: Pulmonary effort is normal.  ?   Breath sounds: Normal breath sounds.  ?Abdominal:  ?   General: Bowel sounds are normal.  ?   Palpations: Abdomen is soft.  ?Musculoskeletal:     ?   General: Normal range of motion.  ?Skin: ?   General: Skin is warm and dry.  ?Neurological:  ?   Mental Status: She is alert and oriented to person, place, and time.  ?Psychiatric:     ?   Behavior: Behavior normal.     ?   Thought Content: Thought content normal.     ?   Judgment: Judgment normal.  ?  ? ?Assessment/Plan ?31 y.o. G2P1001 [redacted]w[redacted]d here for active labor ? ?Active management of labor, will augment with pitocin and ROM ?Normal fetal monitoring per unit policy. ?Reviewed option for pain management with patient. Patient does desire an epidural.  ?Typical admission labs. ?GBS: negative ? ?Adrian Prows MD, FACOG ?[facility], [facility] ?12/06/2021 ?4:18 PM ? ? ? ? ?

## 2021-12-06 NOTE — Progress Notes (Signed)
No vb. No lof . Pt wants cervical check today  

## 2021-12-06 NOTE — Anesthesia Preprocedure Evaluation (Signed)
Anesthesia Evaluation  ?Patient identified by MRN, date of birth, ID band ?Patient awake ? ? ? ?Reviewed: ?Allergy & Precautions, H&P , NPO status , Patient's Chart, lab work & pertinent test results ? ?Airway ?Mallampati: II ? ?TM Distance: >3 FB ?Neck ROM: full ? ? ? Dental ?no notable dental hx. ? ?  ?Pulmonary ?neg pulmonary ROS,  ?  ? ? ? ? ? ? ? Cardiovascular ?negative cardio ROS ? ? ? ? ?  ?Neuro/Psych ?negative neurological ROS ?   ? GI/Hepatic ?Neg liver ROS, GERD  ,  ?Endo/Other  ?negative endocrine ROS ? Renal/GU ?negative Renal ROS  ?negative genitourinary ?  ?Musculoskeletal ? ? Abdominal ?  ?Peds ? Hematology ?negative hematology ROS ?(+)   ?Anesthesia Other Findings ? ? Reproductive/Obstetrics ?(+) Pregnancy ? ?  ? ? ? ? ? ? ? ? ? ? ? ? ? ?  ?  ? ? ? ? ? ? ? ? ?Anesthesia Physical ?Anesthesia Plan ? ?ASA: 2 ? ?Anesthesia Plan: Epidural  ? ?Post-op Pain Management:   ? ?Induction:  ? ?PONV Risk Score and Plan:  ? ?Airway Management Planned:  ? ?Additional Equipment:  ? ?Intra-op Plan:  ? ?Post-operative Plan:  ? ?Informed Consent: I have reviewed the patients History and Physical, chart, labs and discussed the procedure including the risks, benefits and alternatives for the proposed anesthesia with the patient or authorized representative who has indicated his/her understanding and acceptance.  ? ? ? ? ? ?Plan Discussed with: Anesthesiologist and CRNA ? ?Anesthesia Plan Comments:   ? ? ? ? ? ? ?Anesthesia Quick Evaluation ? ?

## 2021-12-06 NOTE — Discharge Summary (Signed)
? ?  Postpartum Discharge Summary ? ?Date of Service updated 12/07/21 ?   ?Patient Name: Mackinzie Conwell ?DOB: 06-13-1991 ?MRN: [id number] ? ?Date of admission: 12/06/2021 ?Delivery date:12/06/2021  ?Delivering provider: Adrian Prows R  ?Date of discharge: 12/07/2021 ? ?Admitting diagnosis: Encounter for elective induction of labor [Z34.90] ?Intrauterine pregnancy: [redacted]w[redacted]d    ?Secondary diagnosis:  Active Problems: ?  Encounter for elective induction of labor ? ?Additional problems: None    ?Discharge diagnosis: Term Pregnancy Delivered                                              ?Post partum procedures: NA ?Augmentation: AROM and Pitocin ?Complications: None ? ?Hospital course: Onset of Labor With Vaginal Delivery      ?31y.o. yo G2P1001 at 449w0das admitted in Active Labor on 12/06/2021. Patient had an uncomplicated labor course as follows:  ?Membrane Rupture Time/Date: 5:01 PM ,12/06/2021   ?Delivery Method:Vaginal, Spontaneous  ?Episiotomy: None  ?Lacerations:    ?Patient had an uncomplicated postpartum course.  She is ambulating, tolerating a regular diet, passing flatus, and urinating well. Breastfeeding independently.  Undecided about tubal ligation. Prefers to go home and think about, may consider it at 6 weeks.  Patient is discharged home in stable condition on 12/07/21. ? ?Newborn Data: ?Birth date:12/06/2021  ?Birth time:6:40 PM  ?Gender:Female  ?Living status:Living  ?Apgars:9 ,9  ?Weight:3530 g  ? ?Magnesium Sulfate received: No ?BMZ received: No ?Rhophylac:No ?MMR:No ?T-DaP:Given prenatally ?Flu: N/A ?Transfusion:No ? ?Physical exam  ?Vitals:  ? 12/06/21 2330 12/07/21 0250 12/07/21 0840 12/07/21 1156  ?BP: 124/80 106/68 121/76 128/83  ?Pulse: 71 74 80 83  ?Resp: _0 ?Temp: 98.2 ?F (36.8 ?C) 98.1 ?F (36.7 ?C) 98.1 ?F (36.7 ?C) 97.7 ?F (36.5 ?C)  ?TempSrc: Oral Oral Oral Oral  ?SpO2: 99% 97% 100% 98%  ?Weight:      ?Height:      ? ?General: alert ?Lochia: appropriate ?Uterine Fundus:  firm ?Incision: N/A ?DVT Evaluation: No evidence of DVT seen on physical exam. ?Labs: ?Lab Results  ?Component Value Date  ? WBC 15.3 (H) 12/07/2021  ? HGB 12.8 12/07/2021  ? HCT 38.1 12/07/2021  ? MCV 91.4 12/07/2021  ? PLT 161 12/07/2021  ? ? ?  Latest Ref Rng & Units 08/01/2020  ?  8:45 AM  ?CMP  ?Glucose 70 - 99 mg/dL 135    ?BUN 6 - 20 mg/dL 7    ?Creatinine 0.44 - 1.00 mg/dL 0.59    ?Sodium 135 - 145 mmol/L 136    ?Potassium 3.5 - 5.1 mmol/L 3.7    ?Chloride 98 - 111 mmol/L 105    ?CO2 22 - 32 mmol/L 20    ?Calcium 8.9 - 10.3 mg/dL 8.8    ?Total Protein 6.5 - 8.1 g/dL 5.9    ?Total Bilirubin 0.3 - 1.2 mg/dL 0.5    ?Alkaline Phos 38 - 126 U/L 142    ?AST 15 - 41 U/L 25    ?ALT 0 - 44 U/L 13    ? ?Edinburgh Score: ? ?  12/07/2021  ?  9:00 AM  ?EdFlavia Shipperostnatal Depression Scale Screening Tool  ?I have been able to laugh and see the funny side of things. 0  ?I have looked forward with enjoyment to things. 0  ?I have blamed myself unnecessarily when things went  wrong. 1  ?I have been anxious or worried for no good reason. 2  ?I have felt scared or panicky for no good reason. 1  ?Things have been getting on top of me. 0  ?I have been so unhappy that I have had difficulty sleeping. 0  ?I have felt sad or miserable. 0  ?I have been so unhappy that I have been crying. 0  ?The thought of harming myself has occurred to me. 0  ?Edinburgh Postnatal Depression Scale Total 4  ? ? ? ? ?After visit meds:  ?Allergies as of 12/07/2021   ?No Known Allergies ?  ? ?  ?Medication List  ?  ? ?STOP taking these medications   ? ?valACYclovir 500 MG tablet ?Commonly known as: VALTREX ?  ? ?  ? ?TAKE these medications   ? ?acetaminophen 500 MG tablet ?Commonly known as: TYLENOL ?Take 2 tablets (1,000 mg total) by mouth every 6 (six) hours. ?  ?budesonide 32 MCG/ACT nasal spray ?Commonly known as: RHINOCORT AQUA ?Place into the nose. ?  ?coconut oil Oil ?Apply 1 application. topically as needed. ?  ?[facility] PRENATAL GUMMY PO ?Take by  mouth. ?  ?dibucaine 1 % Oint ?Commonly known as: NUPERCAINAL ?Place 1 application. rectally as needed for hemorrhoids. ?  ?docusate sodium 100 MG capsule ?Commonly known as: COLACE ?Take 1 capsule (100 mg total) by mouth 2 (two) times daily. ?  ?ibuprofen 600 MG tablet ?Commonly known as: ADVIL ?Take 1 tablet (600 mg total) by mouth every 6 (six) hours. ?  ?MAGNESIUM PO ?Take by mouth. ?  ?simethicone 80 MG chewable tablet ?Commonly known as: MYLICON ?Chew 1 tablet (80 mg total) by mouth as needed for flatulence. ?  ?witch hazel-glycerin pad ?Commonly known as: TUCKS ?Apply 1 application. topically as needed for hemorrhoids. ?  ? ?  ? ?  ?  ? ? ?  ?Discharge Care Instructions  ?(From admission, onward)  ?  ? ? ?  ? ?  Start     Ordered  ? 12/07/21 0000  Discharge wound care:       ?Comments: SHOWER DAILY ?Wash incision gently with soap and water.  ?Call office with any drainage, redness, or firmness of the incision.  ? 12/07/21 1955  ? ?  ?  ? ?  ? ? ? ?Discharge home in stable condition ?Infant Feeding: Bottle ?Infant Disposition:home with mother ?Discharge instruction: per After Visit Summary and Postpartum booklet. ?Activity: Advance as tolerated. Pelvic rest for 6 weeks.  ?Diet: routine diet ?Anticipated Birth Control: Unsure ?Postpartum Appointment:6 weeks ?Additional Postpartum F/U:  4 weeks if tubal desired to arrange procedure  ?Future Appointments:No future appointments. ?Follow up Visit: ? ? ?  ?Roberto Scales, CNM  ?Mosetta Pigeon, [facility]  ?_0 @  ?7:59 PM  ? ? ?

## 2021-12-06 NOTE — OB Triage Note (Signed)
Pt Sandra Arellano 31 y.o. presents to labor and delivery triage reporting ctx . Pt is a G2P1001 at [redacted]w[redacted]d . Pt denies signs and symptons consistent with rupture of membranes or active vaginal bleeding. Pt reports irregular contractions and states positive fetal movement. Pt states she had an OB visit this morning and was seen by Charlsie Merles and was told she is 6/60/-2. Pt did some errands within the area and stated her contractions became more "intense" External FM and TOCO applied to non-tender abdomen and assessing. Initial FHR 155 . Vital signs obtained and within normal limits. Provider notified of pt. ? ?

## 2021-12-06 NOTE — Progress Notes (Signed)
Vaginal Delivery Note ? ?Spontaneous delivery of live viable female infant from the ROA position through an intact perineum. Delivery of anterior left shoulder with gentle downward guidance followed by delivery of the right posterior shoulder with gentle upward guidance. Body followed spontaneously. Infant placed on maternal chest. Nursery present and helped with neonatal resuscitation and evaluation. Cord clamped and cut after one minute. Cord blood collected. Placenta delivered spontaneously and intact with a 3 vessel cord.  Small first degree laceration, not repair required. Uterus firm and below umbilicus at the end of the delivery.  Mom and baby recovering in stable condition. Sponge and needle counts were correct at the end of the delivery. ? ?APGARS: 1 minute:9 5 minutes: 9 ?Weight: pending ?Epidural present ?Laceration: Small first degree laceration, not repair required. ? ?Adelene Idler MD ?[facility], [facility] ?12/06/21 ?6:54 PM ? ?

## 2021-12-06 NOTE — Progress Notes (Signed)
Routine Prenatal Care Visit ? ?Subjective  ?Sandra Arellano is a 31 y.o. G2P1001 at [redacted]w[redacted]d being seen today for ongoing prenatal care.  She is currently monitored for the following issues for this low-risk pregnancy and has Herpes and Supervision of other normal pregnancy, antepartum on their problem list.  ?----------------------------------------------------------------------------------- ?Patient reports having some cramping, irregular contractions, loss of mucous with scant blood. She and her husband are ready for baby. She is staying physically active.   ?Contractions: Irregular. Vag. Bleeding: None.  Movement: Present. Leaking Fluid denies.  ?----------------------------------------------------------------------------------- ?The following portions of the patient's history were reviewed and updated as appropriate: allergies, current medications, past family history, past medical history, past social history, past surgical history and problem list. Problem list updated. ? ?Objective  ?Blood pressure 122/70, weight 178 lb (80.7 kg), last menstrual period 03/01/2021 ?Pregravid weight 160 lb (72.6 kg) Total Weight Gain 18 lb (8.165 kg) ?Urinalysis: Urine Protein    Urine Glucose   ? ?Fetal Status: Fetal Heart Rate (bpm): 135 Fundal Height: 40 cm Movement: Present  Presentation: Vertex ? ?General:  Alert, oriented and cooperative. Patient is in no acute distress.  ?Skin: Skin is warm and dry. No rash noted.   ?Cardiovascular: Normal heart rate noted  ?Respiratory: Normal respiratory effort, no problems with respiration noted  ?Abdomen: Soft, gravid, appropriate for gestational age. Pain/Pressure: Present     ?Pelvic:  Cervical exam performed Dilation: 6 Effacement (%): 60 Station: -2, no HSV lesions, cervical sweep  ?Extremities: Normal range of motion.  Edema: None  ?Mental Status: Normal mood and affect. Normal behavior. Normal judgment and thought content.  ? ?Assessment  ? ?31 y.o. G2P1001 at [redacted]w[redacted]d by   12/06/2021, by Last Menstrual Period presenting for routine prenatal visit ? ?Plan  ? ?pregnancy 2 Problems (from 05/05/21 to present)   ? Problem Noted Resolved  ? Supervision of other normal pregnancy, antepartum 05/05/2021 by Mirna Mires, CNM No  ? Overview Addendum 11/16/2021  9:30 AM by Mirna Mires, CNM  ?   ?Nursing Staff Provider  ?Office Location  [facility] Dating  With an MD  ?Language  English Anatomy US  Normal, normal fu   ?Flu Vaccine   Genetic Screen  NIPS: MaternT- xy, negative  ?TDaP vaccine    Hgb A1C or  ?GTT Early : ?Third trimester : 42  ?Covid    O+  ?Rhogam  NA Blood Type   ?Feeding Plan Breast Antibody Negative (09/08 1304)neg  ?Contraception Condoms/BTL  Rubella 1.67 (09/08 1304)immune  ?Circumcision  RPR Non Reactive (12/29 0949) NR  ?Pediatrician   HBsAg Negative (09/08 1304) negative  ?Support Person Freida Busman  HIV Non Reactive (12/29 0949)negative  ?Prenatal Classes  Varicella immune  ?  GBS Negative/-- (03/03 1129)(For PCN allergy, check sensitivities)   ?BTL Consent Signed 11/04/21    ?VBAC Consent  Pap    ?  Hgb Electro    ?  CF   ?   SMA   ?     ? ? ?  ?  ?  ?  ? ?Term labor symptoms and general obstetric precautions including but not limited to vaginal bleeding, contractions, leaking of fluid and fetal movement were reviewed in detail with the patient. ?Please refer to After Visit Summary for other counseling recommendations.  ? ?Return for IOL on 4/1. ? ?Tresea Mall, CNM ?12/06/2021 11:03 AM   ? ?

## 2021-12-07 LAB — CBC
HCT: 38.1 % (ref 36.0–46.0)
Hemoglobin: 12.8 g/dL (ref 12.0–15.0)
MCH: 30.7 pg (ref 26.0–34.0)
MCHC: 33.6 g/dL (ref 30.0–36.0)
MCV: 91.4 fL (ref 80.0–100.0)
Platelets: 161 10*3/uL (ref 150–400)
RBC: 4.17 MIL/uL (ref 3.87–5.11)
RDW: 12.8 % (ref 11.5–15.5)
WBC: 15.3 10*3/uL — ABNORMAL HIGH (ref 4.0–10.5)
nRBC: 0 % (ref 0.0–0.2)

## 2021-12-07 LAB — RPR: RPR Ser Ql: NONREACTIVE

## 2021-12-07 MED ORDER — DIBUCAINE (PERIANAL) 1 % EX OINT: 1.0000 "application " | TOPICAL_OINTMENT | CUTANEOUS | Status: DC | PRN

## 2021-12-07 MED ORDER — COCONUT OIL OIL: 1.0000 "application " | TOPICAL_OIL | 0 refills | Status: DC | PRN

## 2021-12-07 MED ORDER — SIMETHICONE 80 MG PO CHEW: 80.0000 mg | CHEWABLE_TABLET | ORAL | 0 refills | Status: DC | PRN

## 2021-12-07 MED ORDER — WITCH HAZEL-GLYCERIN EX PADS: 1.0000 "application " | MEDICATED_PAD | CUTANEOUS | 12 refills | Status: DC | PRN

## 2021-12-07 MED ORDER — DOCUSATE SODIUM 100 MG PO CAPS: 100.0000 mg | ORAL_CAPSULE | Freq: Two times a day (BID) | ORAL | 0 refills | Status: DC

## 2021-12-07 MED ORDER — IBUPROFEN 600 MG PO TABS: 600.0000 mg | ORAL_TABLET | Freq: Four times a day (QID) | ORAL | 0 refills | Status: AC

## 2021-12-07 MED ORDER — ACETAMINOPHEN 500 MG PO TABS: 1000.0000 mg | ORAL_TABLET | Freq: Four times a day (QID) | ORAL | 0 refills | Status: DC

## 2021-12-07 NOTE — Progress Notes (Signed)
? ?Subjective:  ?Doing well.  Ambulating and voiding without difficulty. Bleeding WNL. Good appetite. Breastfeeding independently.  Long conversation regarding tubal ligation, including risks/benefits. Sandra Arellano sure that she does not want any more children.  ? ?Objective:  ?Vital signs in last 24 hours: ?Temp:  [97.7 ?F (36.5 ?C)-98.8 ?F (37.1 ?C)] 98.1 ?F (36.7 ?C) (03/29 0840) ?Pulse Rate:  [71-112] 80 (03/29 0840) ?Resp:  [14-18] 14 (03/29 0840) ?BP: (102-125)/(62-95) 121/76 (03/29 0840) ?SpO2:  [94 %-100 %] 100 % (03/29 0840) ?Weight:  [80.7 kg-84.4 kg] 84.4 kg (03/28 1641) ?  ? ?General: NAD ?Pulmonary: no increased work of breathing ?Breasts: soft, no redness, nipples erect and intact bilaterally ?Abdomen: non-distended, non-tender, fundus firm at level of umbilicus ?Perineum: slightly edematous. Lochia small rubra   ?Extremities: no edema, no erythema, no tenderness ? ?Results for orders placed or performed during the hospital encounter of 12/06/21 (from the past 72 hour(s))  ?CBC     Status: Abnormal  ? Collection Time: 12/06/21  2:03 PM  ?Result Value Ref Range  ? WBC 13.7 (H) 4.0 - 10.5 K/uL  ? RBC 4.53 3.87 - 5.11 MIL/uL  ? Hemoglobin 13.9 12.0 - 15.0 g/dL  ? HCT 40.9 36.0 - 46.0 %  ? MCV 90.3 80.0 - 100.0 fL  ? MCH 30.7 26.0 - 34.0 pg  ? MCHC 34.0 30.0 - 36.0 g/dL  ? RDW 12.7 11.5 - 15.5 %  ? Platelets 200 150 - 400 K/uL  ? nRBC 0.0 0.0 - 0.2 %  ?  Comment: Performed at [facility], [address] [place], [place] [address]  ?Type and screen     Status: None  ? Collection Time: 12/06/21  2:03 PM  ?Result Value Ref Range  ? ABO/RH(D) O POS   ? Antibody Screen NEG   ? Sample Expiration    ?  12/09/2021,2359 ?Performed at [facility], [address] [place], [place] [address] ?  ?RPR     Status: None  ? Collection Time: 12/06/21  2:03 PM  ?Result Value Ref Range  ? RPR Ser Ql NON REACTIVE NON REACTIVE  ?  Comment: Performed at [facility], [address], [place], [place]  [address]  ?CBC     Status: Abnormal  ? Collection Time: 12/07/21  6:17 AM  ?Result Value Ref Range  ? WBC 15.3 (H) 4.0 - 10.5 K/uL  ? RBC 4.17 3.87 - 5.11 MIL/uL  ? Hemoglobin 12.8 12.0 - 15.0 g/dL  ? HCT 38.1 36.0 - 46.0 %  ? MCV 91.4 80.0 - 100.0 fL  ? MCH 30.7 26.0 - 34.0 pg  ? MCHC 33.6 30.0 - 36.0 g/dL  ? RDW 12.8 11.5 - 15.5 %  ? Platelets 161 150 - 400 K/uL  ? nRBC 0.0 0.0 - 0.2 %  ?  Comment: Performed at [facility], [address], [place], [place] [address]  ? ? ?Assessment:  ? 31 y.o. G2P2002 postpartum day # 15hours  ? ?Plan:  ? ? ?1) Acute blood loss anemia - hemodynamically stable and asymptomatic ?- po ferrous sulfate ? ?2) Blood Type --/--/O POS (03/28 1403) / Rubella 1.67 (09/08 1304) / Varicella Immune ? ?3) TDAP status up to date ? ?4) Feeding plan breast ? ?5)  Education given regarding options for contraception, as well as compatibility with breast feeding if applicable.  Patient plans on tubal ligation for contraception. NPO after midnight ? ?6) Disposition discharge 3/30 after tubal ligation ? ?Isabelle Course  Ronnica Dreese, CNM  ?[facility], [facility] ?12/07/2021, 9:49 AM ? ?  ?

## 2021-12-07 NOTE — Anesthesia Postprocedure Evaluation (Signed)
Anesthesia Post Note ? ?Patient: Sandra Arellano ? ?Procedure(s) Performed: AN AD HOC LABOR EPIDURAL ? ?Patient location during evaluation: Mother Baby ?Anesthesia Type: Epidural ?Level of consciousness: awake, awake and alert and oriented ?Pain management: pain level controlled ?Vital Signs Assessment: post-procedure vital signs reviewed and stable ?Respiratory status: spontaneous breathing, respiratory function stable and nonlabored ventilation ?Cardiovascular status: stable ?Postop Assessment: no headache and no backache ?Anesthetic complications: no ? ? ?No notable events documented. ? ? ?Last Vitals:  ?Vitals:  ? 12/06/21 2330 12/07/21 0250  ?BP: 124/80 106/68  ?Pulse: 71 74  ?Resp: 18 18  ?Temp: 36.8 ?C 36.7 ?C  ?SpO2: 99% 97%  ?  ?Last Pain:  ?Vitals:  ? 12/07/21 0250  ?TempSrc: Oral  ?PainSc:   ? ? ?  ?  ?  ?  ?  ?  ? ?Sandra Arellano ? ? ? ? ?

## 2021-12-07 NOTE — Progress Notes (Signed)
Patient d/c home with infant. D/c instructions, Rx, and f/u appt given to and reviewed with pt. Pt verbalized understanding. Escorted out by [facility].  ?

## 2021-12-07 NOTE — Lactation Note (Signed)
This note was copied from a baby's chart. ?Lactation Consultation Note ? ?Patient Name: Sandra Arellano ?Today's Date: 12/07/2021 ?Reason for consult: Initial assessment;Term;Breastfeeding assistance;Other (Comment) (Mother with concerns of potential for engorgement as she experienced this breastfeeding with first  child) ?Age:29 hours ? ?Maternal Data ?Has patient been taught Hand Expression?: Yes ?Does the patient have breastfeeding experience prior to this delivery?: Yes ?How Marquesa Rath did the patient breastfeed?: 2 months ?G2P2 Mom experience in breastfeeding previous child(62 month old). Initial feeding goal for this baby was to breast feed "at least to 2wks" as per Mom; Mom has concerns about managing 60month old, newborn and how to also breastfeed. Father of baby very supportive  ?Feeding ?Mother's Current Feeding Choice: Breast Milk ?Observed feeding; Mom was able to position and latch in her position of comfort (football hold) ?LATCH Score ?Latch: Grasps breast easily, tongue down, lips flanged, rhythmical sucking. (Mother was instructed to adjust lower lip to flange outward more) ? ?Audible Swallowing: Spontaneous and intermittent ? ?Type of Nipple: Everted at rest and after stimulation ? ?Comfort (Breast/Nipple): Soft / non-tender (Mother noted to have compresion on tip of nipple; reports this also happened with first baby. Provided Mother in positioning and latch.) ? ?Hold (Positioning): Assistance needed to correctly position infant at breast and maintain latch. ? ?LATCH Score: 9 ?  ? ?Interventions ?Interventions: Breast feeding basics reviewed;Assisted with latch;Skin to skin;Breast massage;Hand express;Breast compression;Adjust position;Support pillows;Position options;Education (Referenced Enjoy book to parents) ?Addressed  Mother's concerns with engorgement, oversupply with previous child, provided tips and strategies.Also provided strategies to help manage breastfeeding newborn while still caring  for 23 month old ?Discharge ?Discharge Education: Engorgement and breast care;Warning signs for feeding baby;Outpatient recommendation ?Pump: Hands Free;Manual ? ?Consult Status ?Consult Status: PRN ?Date: 12/07/21 ? ? ? ?Durga Saldarriaga ?12/07/2021, 5:04 PM ? ? ? ?

## 2022-01-10 ENCOUNTER — Ambulatory Visit: Payer: 59 | Admitting: Licensed Practical Nurse

## 2022-01-11 ENCOUNTER — Other Ambulatory Visit (HOSPITAL_COMMUNITY)
Admission: RE | Admit: 2022-01-11 | Discharge: 2022-01-11 | Disposition: A | Discharge status: Home / Self Care | Payer: 59 | Source: Ambulatory Visit | Attending: Licensed Practical Nurse | Admitting: Licensed Practical Nurse

## 2022-01-11 ENCOUNTER — Ambulatory Visit (INDEPENDENT_AMBULATORY_CARE_PROVIDER_SITE_OTHER): Payer: 59 | Admitting: Licensed Practical Nurse

## 2022-01-11 DIAGNOSIS — Z30011 Encounter for initial prescription of contraceptive pills: Secondary | ICD-10-CM

## 2022-01-11 DIAGNOSIS — Z124 Encounter for screening for malignant neoplasm of cervix: Secondary | ICD-10-CM | POA: Diagnosis not present

## 2022-01-11 MED ORDER — NORETHIN ACE-ETH ESTRAD-FE 1-20 MG-MCG PO TABS: 1.0000 | ORAL_TABLET | Freq: Every day | ORAL | 11 refills | Status: DC

## 2022-01-11 NOTE — Progress Notes (Signed)
?Postpartum Visit  ?Chief Complaint:  ?Chief Complaint  ?Patient presents with  ? Postpartum Care  ? ? ?History of Present Illness: Patient is a 31 y.o. [id number] presents for postpartum visit. ? ?Date of delivery: 12/06/2021 ?Type of delivery: Vaginal delivery - Vacuum or forceps assisted  no ?Episiotomy No.  ?Laceration: yes first degree not repaired ?Pregnancy or labor problems:  no ?Any problems since the delivery:  no ?Bleeding stopped after 2 weeks ?Sleep is as expected with a NB ?Stopped breast feeding because her "milk dried up" ?No concerns with voiding or MB ?Has not had IC yet, is pretty sure she does not desire any more kids, has considered BTL but not ready for the permanence, desires OCP, does not smoker, denies hx of migraines with aura or CHTN. ?Mood has been great.  ? ?Dental exam: needs ?PCP: currently looking  ? ?Newborn Details:  ?SINGLETON :  ?1. Baby healthy and well  ?Maternal Details:  ?Breast Feeding:  no ?Post partum depression/anxiety noted:  no ?Edinburgh Post-Partum Depression Score:  0  ?Date of last PAP: 08/05/2019  normal  ? ?Past Medical History:  ?Diagnosis Date  ? Acne   ? HSV-2 (herpes simplex virus 2) infection   ? ? ?Past Surgical History:  ?Procedure Laterality Date  ? NO PAST SURGERIES    ? ? ?Prior to Admission medications   ?Medication Sig Start Date End Date Taking? Authorizing Provider  ?acetaminophen (TYLENOL) 500 MG tablet Take 2 tablets (1,000 mg total) by mouth every 6 (six) hours. 12/07/21  Yes Megean Fabio, Courtney Heys, CNM  ?dibucaine (NUPERCAINAL) 1 % OINT Place 1 application. rectally as needed for hemorrhoids. 12/07/21  Yes Maicee Ullman, Courtney Heys, CNM  ?MAGNESIUM PO Take by mouth.   Yes [provider]  ?norethindrone-ethinyl estradiol-FE (JUNEL FE 1/20) 1-20 MG-MCG tablet Take 1 tablet by mouth daily. 01/11/22  Yes Marcellino Fidalgo, Courtney Heys, CNM  ?Prenatal MV & Min w/FA-DHA ([employer] PRENATAL GUMMY PO) Take by mouth.   Yes [provider]  ?budesonide (RHINOCORT  AQUA) 32 MCG/ACT nasal spray Place into the nose. 11/30/21 12/30/21  [provider]  ?coconut oil OIL Apply 1 application. topically as needed. ?Patient not taking: Reported on 01/11/2022 12/07/21   Ellwood Sayers, CNM  ?docusate sodium (COLACE) 100 MG capsule Take 1 capsule (100 mg total) by mouth 2 (two) times daily. ?Patient not taking: Reported on 01/11/2022 12/07/21   Ellwood Sayers, CNM  ?ibuprofen (ADVIL) 600 MG tablet Take 1 tablet (600 mg total) by mouth every 6 (six) hours. ?Patient not taking: Reported on 01/11/2022 12/07/21   Ellwood Sayers, CNM  ?simethicone (MYLICON) 80 MG chewable tablet Chew 1 tablet (80 mg total) by mouth as needed for flatulence. ?Patient not taking: Reported on 01/11/2022 12/07/21   Ellwood Sayers, CNM  ?witch hazel-glycerin (TUCKS) pad Apply 1 application. topically as needed for hemorrhoids. ?Patient not taking: Reported on 01/11/2022 12/07/21   Ellwood Sayers, CNM  ? ? ?No Known Allergies  ? ?Social History  ? ?Socioeconomic History  ? Marital status: Married  ?  Spouse name: Freida Busman  ? Number of children: Not on file  ? Years of education: Not on file  ? Highest education level: Not on file  ?Occupational History  ? Not on file  ?Tobacco Use  ? Smoking status: Never  ? Smokeless tobacco: Never  ?Vaping Use  ? Vaping Use: Never used  ?Substance and Sexual Activity  ? Alcohol use: Not Currently  ?  Comment:  occas  ? Drug use: No  ? Sexual activity: Yes  ?  Birth control/protection: Surgical  ?  Comment: BTL  ?Other Topics Concern  ? Not on file  ?Social History Narrative  ? Not on file  ? ?Social Determinants of Health  ? ?Financial Resource Strain: Not on file  ?Food Insecurity: Not on file  ?Transportation Needs: Not on file  ?Physical Activity: Not on file  ?Stress: Not on file  ?Social Connections: Not on file  ?Intimate Partner Violence: Not on file  ? ? ?Family History  ?Problem Relation Age of Onset  ? Diabetes Maternal Grandmother   ? Heart disease  Maternal Grandmother   ? Diabetes Maternal Grandfather   ? COPD Maternal Grandfather   ? Breast cancer Paternal Grandmother   ? Diabetes Paternal Grandmother   ? Diabetes Paternal Grandfather   ? Kidney disease Paternal Grandfather   ? Hypertension Father   ? Ovarian cancer Neg Hx   ? Colon cancer Neg Hx   ? ? ?Review of Systems  ?Respiratory: Negative.    ?Cardiovascular: Negative.   ?Gastrointestinal: Negative.   ?Genitourinary: Negative.   ?Endo/Heme/Allergies: Negative.   ?Psychiatric/Behavioral: Negative.     ? ?Physical Exam ?BP 120/82   Ht 5\' 2"  (1.575 m)   Wt 163 lb (73.9 kg)   Breastfeeding No   BMI 29.81 kg/m?   ?Physical Exam ?Constitutional:   ?   Appearance: Normal appearance.  ?Genitourinary:  ?   Vulva normal.  ?   Genitourinary Comments: Cervix pink, no lesions. Vagina WNL ?Bimanual exam: non gravid uterus, non tender no masses, adnexa non-tender no masses  ?Cardiovascular:  ?   Rate and Rhythm: Normal rate and regular rhythm.  ?   Pulses: Normal pulses.  ?   Heart sounds: Normal heart sounds.  ?Pulmonary:  ?   Effort: Pulmonary effort is normal.  ?   Breath sounds: Normal breath sounds.  ?Chest:  ?   Comments: Breasts: soft, no masses or redness, nipples intact bilaterally  ?Abdominal:  ?   General: Abdomen is flat. There is no distension.  ?   Tenderness: There is no abdominal tenderness.  ?Musculoskeletal:     ?   General: Normal range of motion.  ?   Cervical back: Normal range of motion.  ?Neurological:  ?   General: No focal deficit present.  ?   Mental Status: She is alert and oriented to person, place, and time.  ?Skin: ?   General: Skin is warm.  ?Psychiatric:     ?   Mood and Affect: Mood normal.  ?  ? ? ?Assessment: 31 y.o. 26 presenting for 6 week postpartum visit ? ?Plan: ?Problem List Items Addressed This Visit   ?None ?Visit Diagnoses   ? ? Encounter for initial prescription of contraceptive pills    -  Primary  ? Relevant Medications  ? norethindrone-ethinyl estradiol-FE  (JUNEL FE 1/20) 1-20 MG-MCG tablet  ? Other Relevant Orders  ? Cytology - PAP  ? Cervical cancer screening      ? Relevant Orders  ? Cytology - PAP  ? Encounter for postpartum visit      ? ?  ? ? ? ?1) Contraception Education given regarding options for contraception, including barrier methods, injectable contraception, IUD placement, oral contraceptives. Script for OCP's sent  ? ?2)  Pap ?- ASCCP guidelines and rational discussed.  Patient opts for 3 screening interval ? ?3) Patient underwent screening for postpartum depression with NO concerns noted. ? ?  4) Follow up 1 year for routine annual exam ? ?Carie CaddyLydia Euphemia Lingerfelt, CNM  ?[facility]  ?01/11/22  ?1:07 PM  ? ?

## 2022-01-17 LAB — CYTOLOGY - PAP: Diagnosis: HIGH — AB

## 2022-02-13 ENCOUNTER — Ambulatory Visit (INDEPENDENT_AMBULATORY_CARE_PROVIDER_SITE_OTHER): Payer: 59 | Admitting: Family Medicine

## 2022-02-13 ENCOUNTER — Other Ambulatory Visit (HOSPITAL_COMMUNITY)
Admission: RE | Admit: 2022-02-13 | Discharge: 2022-02-13 | Disposition: A | Discharge status: Home / Self Care | Payer: 59 | Source: Ambulatory Visit | Attending: Family Medicine | Admitting: Family Medicine

## 2022-02-13 ENCOUNTER — Encounter: Payer: Self-pay | Admitting: Family Medicine

## 2022-02-13 VITALS — BP 118/74 | Wt 159.0 lb

## 2022-02-13 DIAGNOSIS — N879 Dysplasia of cervix uteri, unspecified: Secondary | ICD-10-CM | POA: Diagnosis not present

## 2022-02-13 DIAGNOSIS — R87613 High grade squamous intraepithelial lesion on cytologic smear of cervix (HGSIL): Secondary | ICD-10-CM | POA: Insufficient documentation

## 2022-02-13 DIAGNOSIS — N72 Inflammatory disease of cervix uteri: Secondary | ICD-10-CM | POA: Diagnosis not present

## 2022-02-13 NOTE — Progress Notes (Signed)
     GYNECOLOGY OFFICE COLPOSCOPY PROCEDURE NOTE  31 y.o. [id number] here for colposcopy for high-grade squamous intraepithelial neoplasia  (HGSIL-encompassing moderate and severe dysplasia) pap smear on 01/11/22. Last pap in 2020 was NIL. Discussed role for HPV in cervical dysplasia, need for surveillance. Patient "thinks" she had HPV vaccines. Has 18 mon and 2 mon old children.   Subjective:  BP 118/74   Wt 159 lb (72.1 kg)   LMP 02/02/2022   BMI 29.08 kg/m   Patient gave informed written consent, time out was performed.  Placed in lithotomy position. Cervix viewed with speculum and colposcope after application of acetic acid.   Colposcopy adequate? Yes Acetowhite lesion(s) noted at 12 - 3 o'clock and punctation noted at 3 o'clock; corresponding biopsies obtained.  ECC specimen obtained. All specimens were labeled and sent to pathology.  Chaperone was present during entire procedure.  Assessment and plan: Patient was given post procedure instructions.  Will follow up pathology and manage accordingly; patient will be contacted with results and recommendations.  Routine preventative health maintenance measures emphasized. Recommended confirming her HPV vaccine status. Discussed repeat pap in 6 months/1 year regardless of colpo results and possible need for LEEP.   Federico Flake, MD, MPH, ABFM, [facility] for [facility]

## 2022-02-16 LAB — SURGICAL PATHOLOGY

## 2022-02-17 ENCOUNTER — Ambulatory Visit (INDEPENDENT_AMBULATORY_CARE_PROVIDER_SITE_OTHER): Payer: 59

## 2022-02-17 DIAGNOSIS — Z23 Encounter for immunization: Secondary | ICD-10-CM | POA: Diagnosis not present

## 2022-02-17 LAB — POCT URINE PREGNANCY: Preg Test, Ur: NEGATIVE

## 2022-02-17 NOTE — Progress Notes (Signed)
Patient presents today for Gardisil #1. LMP 02/01/2022 . Patient not currently on menses. Pregnancy test performed.(negative). Given IM Left Deltoid. Patient tolerated well.  Next Due: 2 months ~04/19/2022

## 2022-03-01 DIAGNOSIS — L2089 Other atopic dermatitis: Secondary | ICD-10-CM | POA: Diagnosis not present

## 2022-03-01 DIAGNOSIS — L718 Other rosacea: Secondary | ICD-10-CM | POA: Diagnosis not present

## 2022-03-01 DIAGNOSIS — L858 Other specified epidermal thickening: Secondary | ICD-10-CM | POA: Diagnosis not present

## 2022-03-20 ENCOUNTER — Encounter: Payer: Self-pay | Admitting: Family Medicine

## 2022-03-27 ENCOUNTER — Telehealth: Payer: Self-pay

## 2022-03-27 NOTE — Telephone Encounter (Signed)
Sandra Arellano, called into triage stating that she's 3 month postpartum and she's having a period every other week, lasting a week. She wants to start a new birth control that will help this issue without coming into the office.

## 2022-03-27 NOTE — Telephone Encounter (Signed)
Disregard Dr. Alvester Morin has responded.

## 2022-03-30 ENCOUNTER — Ambulatory Visit (INDEPENDENT_AMBULATORY_CARE_PROVIDER_SITE_OTHER): Payer: 59 | Admitting: Licensed Practical Nurse

## 2022-03-30 ENCOUNTER — Encounter: Payer: Self-pay | Admitting: Licensed Practical Nurse

## 2022-03-30 VITALS — BP 120/80 | Ht 62.0 in | Wt 155.0 lb

## 2022-03-30 DIAGNOSIS — Z3009 Encounter for other general counseling and advice on contraception: Secondary | ICD-10-CM | POA: Diagnosis not present

## 2022-03-30 DIAGNOSIS — Z30019 Encounter for initial prescription of contraceptives, unspecified: Secondary | ICD-10-CM

## 2022-03-30 MED ORDER — SLYND 4 MG PO TABS: 4.0000 mg | ORAL_TABLET | Freq: Every day | ORAL | 12 refills | Status: DC

## 2022-03-30 NOTE — Progress Notes (Signed)
Obstetrics & Gynecology Office Visit   Chief Complaint:  Chief Complaint  Patient presents with   Contraception    Want a different pill. Been having 2 periods a month.    History of Present Illness: Started on Junel at 6wk PP, desired OCP's at that time. Since then she has been experiencing heavy bleeding for for 5-7 days every other week. She has tried the IUD in the past and did not like it.  Was given Slynd by Dr Jean Rosenthal after her previous birth, desires Slynd today.    Review of Systems: heavy bleeding x 5-7 days every other week   Past Medical History:  Past Medical History:  Diagnosis Date   Acne    HSV-2 (herpes simplex virus 2) infection     Past Surgical History:  Past Surgical History:  Procedure Laterality Date   NO PAST SURGERIES      Gynecologic History: Patient's last menstrual period was 03/20/2022.  Obstetric History: G2P2002  Family History:  Family History  Problem Relation Age of Onset   Diabetes Maternal Grandmother    Heart disease Maternal Grandmother    Diabetes Maternal Grandfather    COPD Maternal Grandfather    Breast cancer Paternal Grandmother    Diabetes Paternal Grandmother    Diabetes Paternal Grandfather    Kidney disease Paternal Grandfather    Hypertension Father    Ovarian cancer Neg Hx    Colon cancer Neg Hx     Social History:  Social History   Socioeconomic History   Marital status: Married    Spouse name: Educational psychologist   Number of children: Not on file   Years of education: Not on file   Highest education level: Not on file  Occupational History   Not on file  Tobacco Use   Smoking status: Never   Smokeless tobacco: Never  Vaping Use   Vaping Use: Never used  Substance and Sexual Activity   Alcohol use: Not Currently    Comment: occas   Drug use: No   Sexual activity: Yes    Birth control/protection: Surgical    Comment: BTL  Other Topics Concern   Not on file  Social History Narrative   Not on file    Social Determinants of Health   Financial Resource Strain: Not on file  Food Insecurity: Not on file  Transportation Needs: Not on file  Physical Activity: Not on file  Stress: Not on file  Social Connections: Not on file  Intimate Partner Violence: Not on file    Allergies:  No Known Allergies  Medications: Prior to Admission medications   Medication Sig Start Date End Date Taking? Authorizing Provider  Drospirenone (SLYND) 4 MG TABS Take 4 mg by mouth daily. 03/30/22  Yes Camber Ninh, Courtney Heys, CNM  acetaminophen (TYLENOL) 500 MG tablet Take 2 tablets (1,000 mg total) by mouth every 6 (six) hours. Patient not taking: Reported on 03/30/2022 12/07/21   Ellwood Sayers, CNM  budesonide (RHINOCORT AQUA) 32 MCG/ACT nasal spray Place into the nose. 11/30/21 12/30/21  [provider]  coconut oil OIL Apply 1 application. topically as needed. Patient not taking: Reported on 01/11/2022 12/07/21   DominicCourtney Heys, CNM  dibucaine (NUPERCAINAL) 1 % OINT Place 1 application. rectally as needed for hemorrhoids. Patient not taking: Reported on 03/30/2022 12/07/21   Ellwood Sayers, CNM  docusate sodium (COLACE) 100 MG capsule Take 1 capsule (100 mg total) by mouth 2 (two) times daily. Patient not taking:  Reported on 01/11/2022 12/07/21   DominicCourtney Heys, CNM  ibuprofen (ADVIL) 600 MG tablet Take 1 tablet (600 mg total) by mouth every 6 (six) hours. Patient not taking: Reported on 01/11/2022 12/07/21   Ellwood Sayers, CNM  MAGNESIUM PO Take by mouth. Patient not taking: Reported on 03/30/2022    [provider]  Prenatal MV & Min w/FA-DHA ([employer] PRENATAL GUMMY PO) Take by mouth. Patient not taking: Reported on 03/30/2022    [provider]  simethicone (MYLICON) 80 MG chewable tablet Chew 1 tablet (80 mg total) by mouth as needed for flatulence. Patient not taking: Reported on 01/11/2022 12/07/21   DominicCourtney Heys, CNM  witch hazel-glycerin (TUCKS) pad Apply  1 application. topically as needed for hemorrhoids. Patient not taking: Reported on 01/11/2022 12/07/21   Ellwood Sayers, CNM    Physical Exam Vitals:  Vitals:   03/30/22 1049  BP: 120/80   Patient's last menstrual period was 03/20/2022.  General: NAD   Psychiatric: mood appropriate, affect full   Assessment: 31 y.o. [id number] desires new contraception prescription   Plan: Problem List Items Addressed This Visit   None Visit Diagnoses     Encounter for other general counseling or advice on contraception    -  Primary   Relevant Medications   Drospirenone (SLYND) 4 MG TABS   Encounter for initial prescription of contraceptives, unspecified contraceptive           Carie Caddy, CNM  [facility], [facility]  04/02/22  9:48 AM

## 2022-04-13 ENCOUNTER — Ambulatory Visit (INDEPENDENT_AMBULATORY_CARE_PROVIDER_SITE_OTHER): Payer: 59

## 2022-04-13 DIAGNOSIS — Z23 Encounter for immunization: Secondary | ICD-10-CM

## 2022-04-14 ENCOUNTER — Ambulatory Visit: Payer: 59

## 2022-09-14 DIAGNOSIS — L2089 Other atopic dermatitis: Secondary | ICD-10-CM | POA: Diagnosis not present

## 2022-09-14 DIAGNOSIS — L718 Other rosacea: Secondary | ICD-10-CM | POA: Diagnosis not present

## 2023-02-21 ENCOUNTER — Other Ambulatory Visit: Payer: Self-pay

## 2023-02-21 DIAGNOSIS — B009 Herpesviral infection, unspecified: Secondary | ICD-10-CM

## 2023-02-21 DIAGNOSIS — Z3401 Encounter for supervision of normal first pregnancy, first trimester: Secondary | ICD-10-CM

## 2023-02-21 DIAGNOSIS — Z3A37 37 weeks gestation of pregnancy: Secondary | ICD-10-CM

## 2023-02-26 IMAGING — US US OB COMP +14 WK
1 series · 15 of 28 positions shown · non-contrast
Comparison: none

CLINICAL DATA: Second trimester pregnancy for fetal anatomy survey.

EXAM:
OBSTETRICAL ULTRASOUND >14 WKS

[Series 1: us ob comp + 14 wk · 15 of 71 slices shown]
[im 1/71]
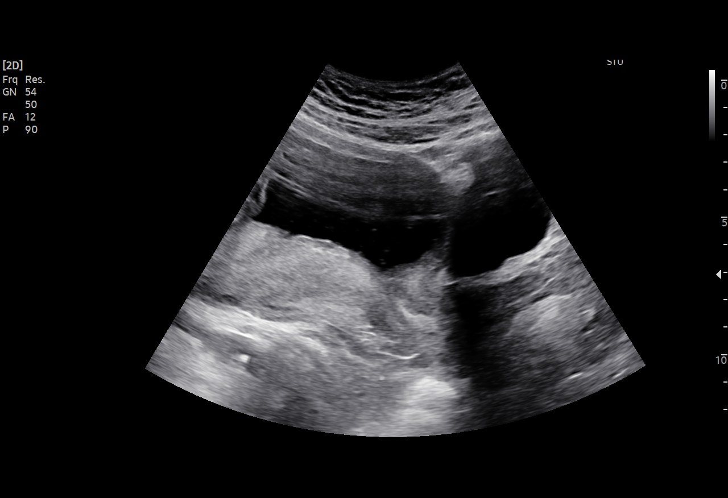
[im 6/71]
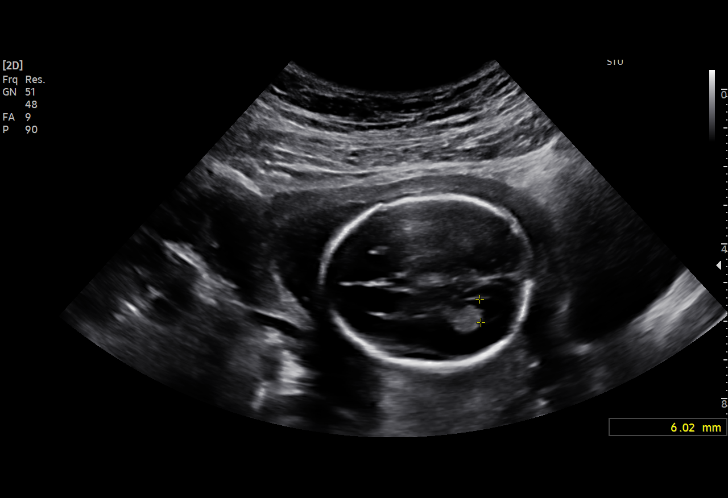
[im 11/71]
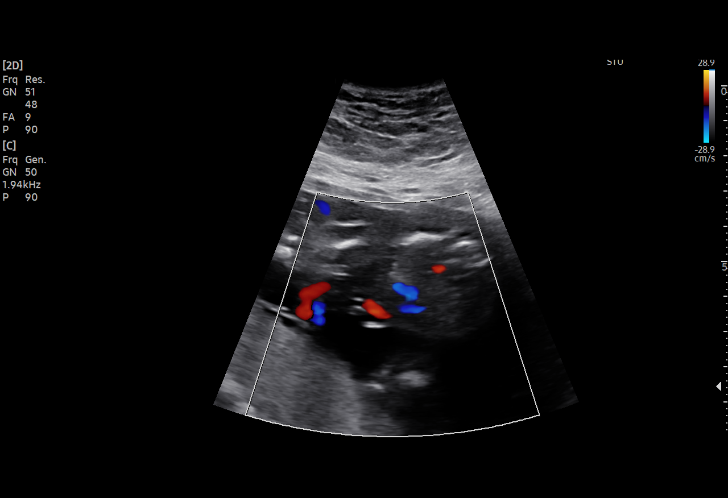
[im 16/71]
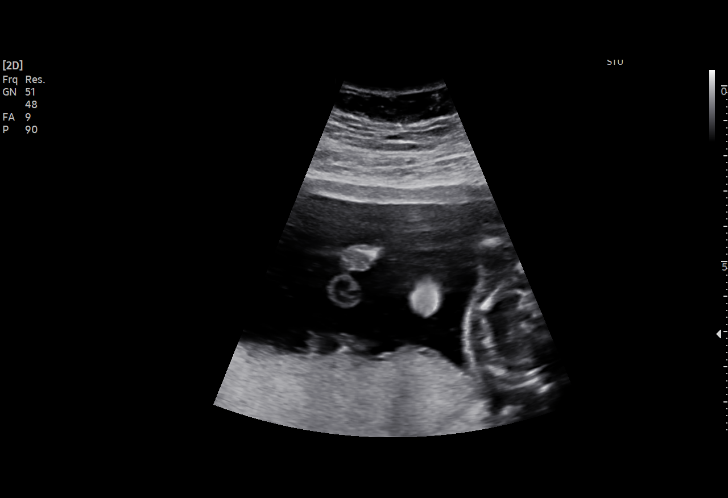
[im 21/71]
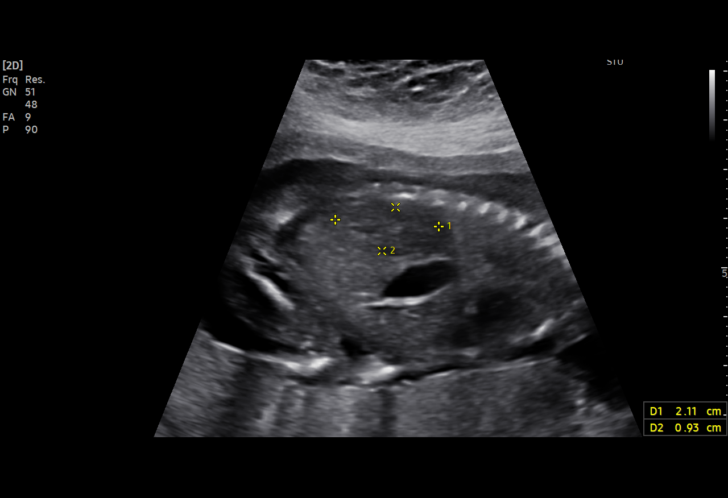
[im 26/71]
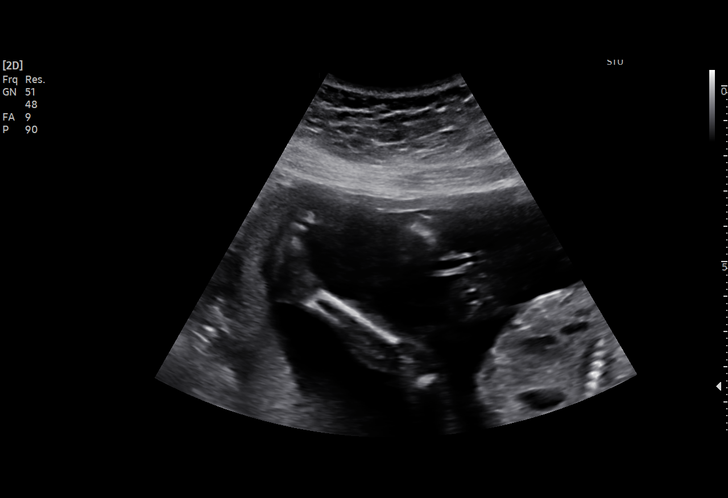
[im 32/71]
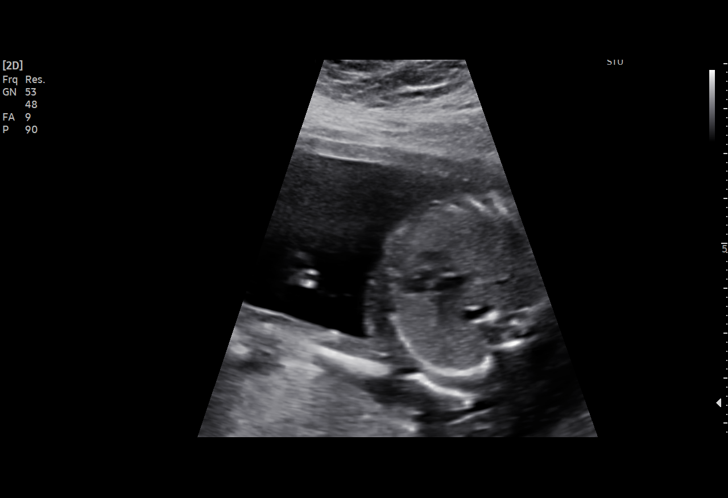
[im 37/71]
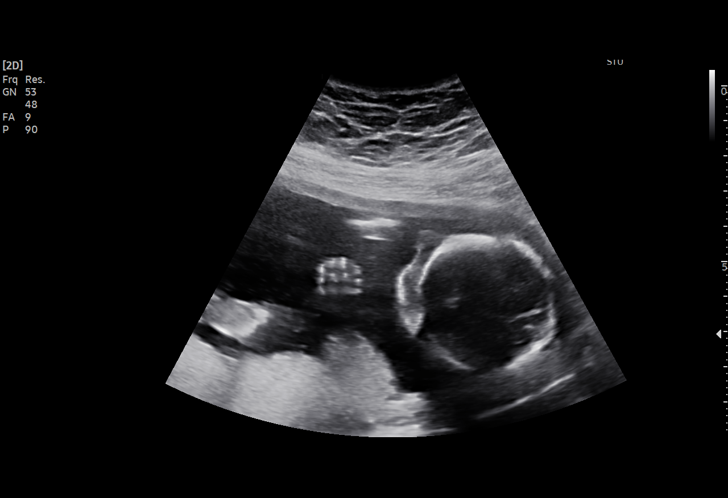
[im 39/71]
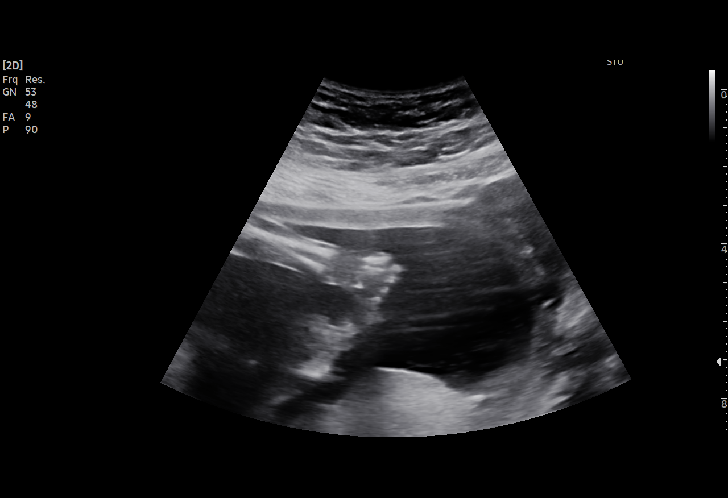
[im 45/71]
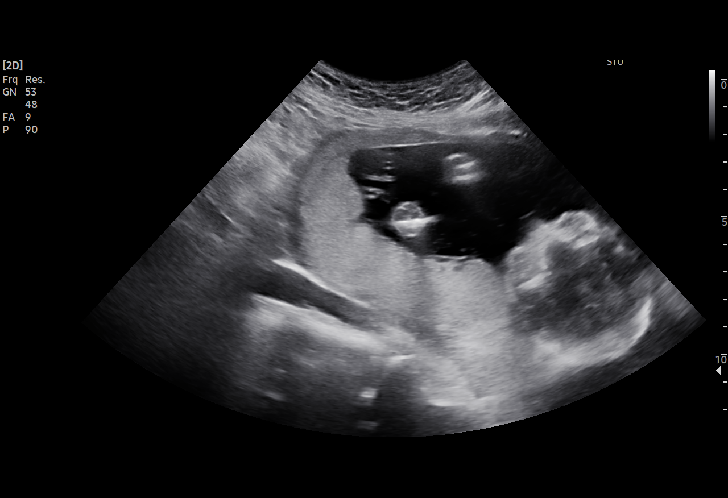
[im 50/71]
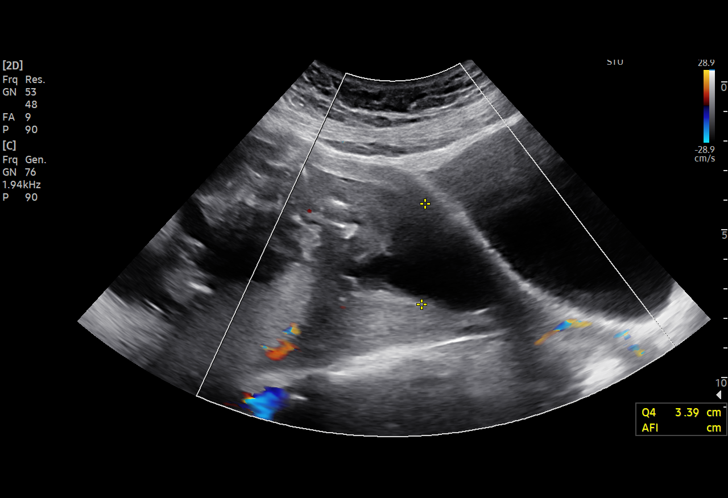
[im 55/71]
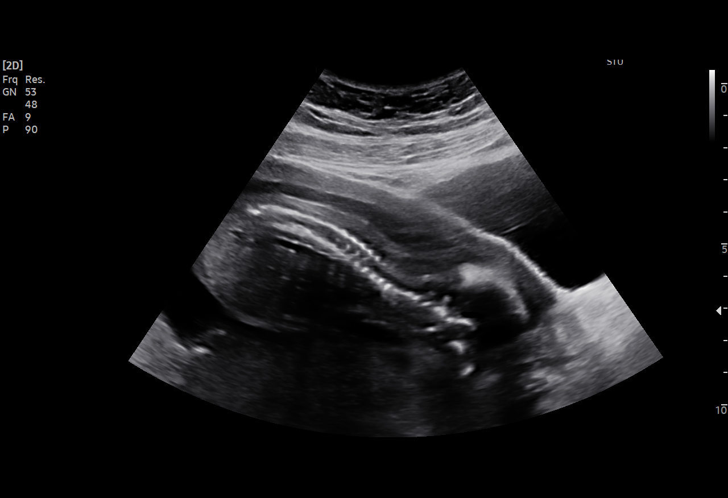
[im 60/71]
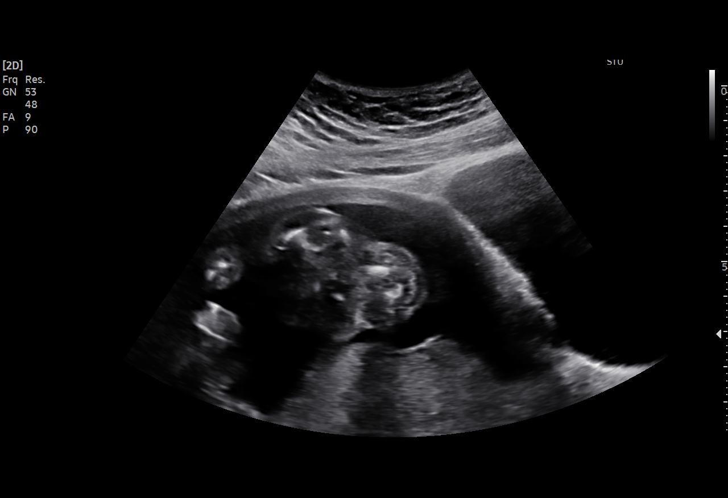
[im 65/71]
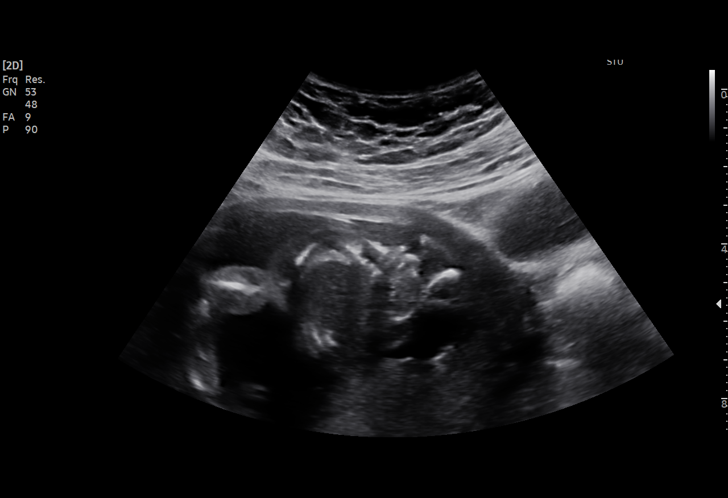
[im 71/71]
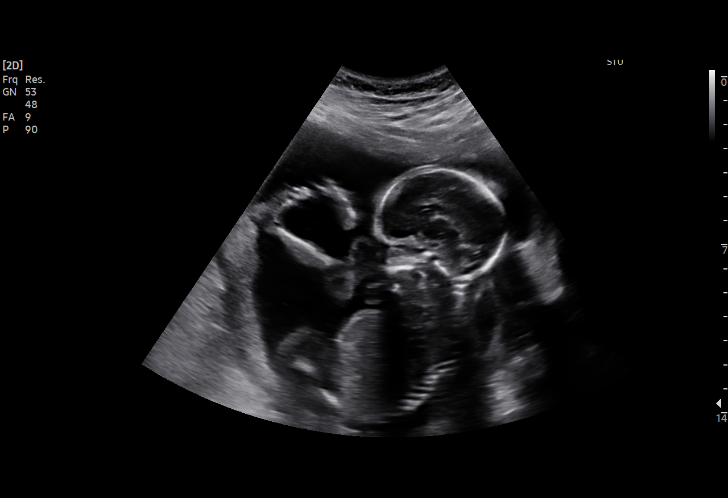

[15 of 28 positions shown; findings below may reference images not displayed]

FINDINGS: Number of Fetuses: 1

Heart Rate:  129 bpm

Movement: Yes

Presentation: Variable

Previa: No

Placental Location: Posterior

Amniotic Fluid (Subjective): Within normal limits

Amniotic Fluid (Objective):

Vertical pocket = 4.8cm

FETAL BIOMETRY

BPD: 4.3cm 19w 1d

HC:   16.4cm 19w 1d

AC:   14.6cm 20w 0d

FL:   3.2cm 20w 1d

Current Mean GA: 19w 4 d US EDC: 12/03/2021

FETAL ANATOMY

Lateral Ventricles: Appears normal

Thalami/CSP: Appears normal

Posterior Fossa:  Appears normal

Nuchal Region: Appears normal   NFT= 3.4 mm

Upper Lip: Appears normal

Spine: Appears normal

4 Chamber Heart on Left: Appears normal

LVOT: Not visualized

RVOT: Not visualized

Stomach on Left: Appears normal

3 Vessel Cord: Appears normal

Cord Insertion site: Appears normal

Kidneys: Appears normal

Bladder: Appears normal

Extremities: Appears normal

Sex: Male

Technically difficult due to: Fetal position

Maternal Findings:

Cervix:  4.3 cm TA
IMPRESSION: Single living IUP with estimated gestational age of 19 weeks 4 days,
and US EDC of 12/03/2021.

No fetal anomalies identified, although cardiac outflow tracts could
not be visualized. Consider followup ultrasound in 3-4 weeks to
complete anatomic evaluation.

## 2023-03-26 IMAGING — US US OB FOLLOW-UP
1 series · 13 of 28 positions shown · non-contrast
Comparison: none

CLINICAL DATA: Follow-up fetal anatomy and growth.

EXAM:
OBSTETRIC 14+ WK ULTRASOUND FOLLOW-UP

[Series 1: us ob follow up · 13 of 54 slices shown]
[im 2/54]
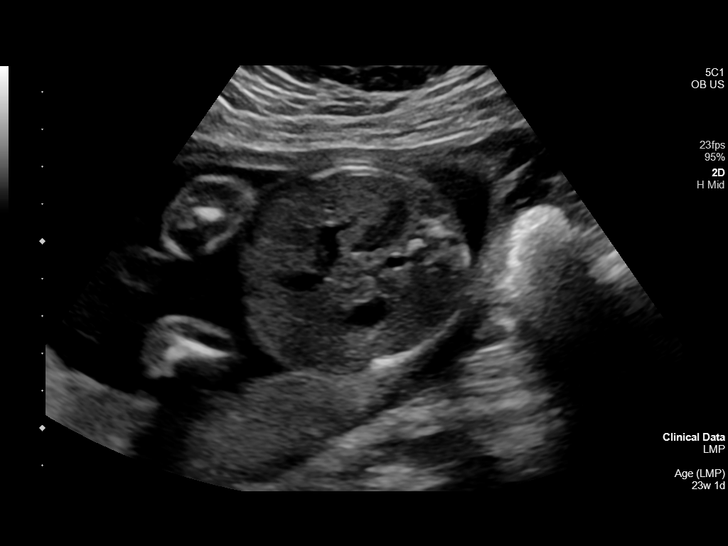
[im 6/54]
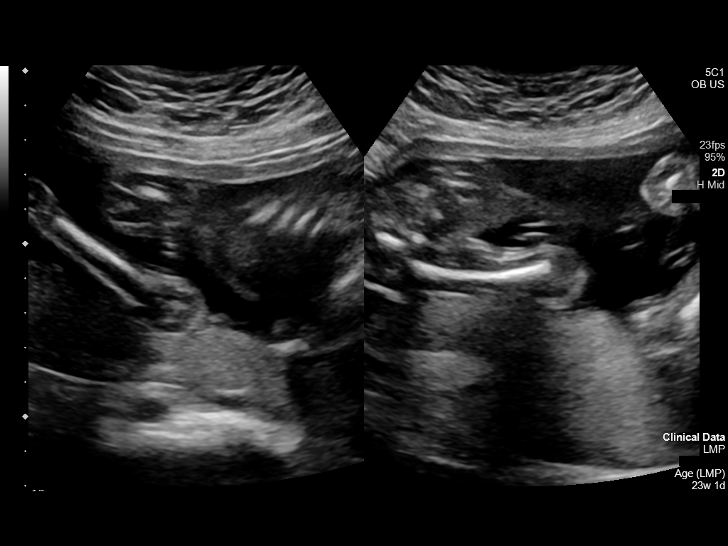
[im 10/54]
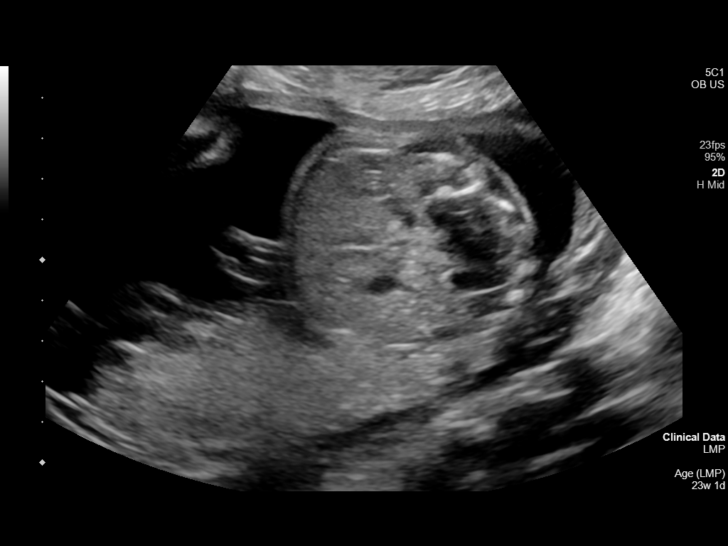
[im 14/54]
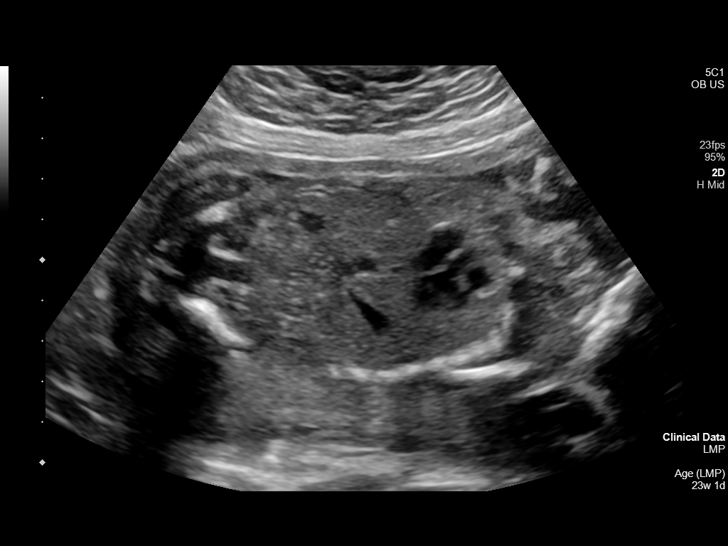
[im 18/54]
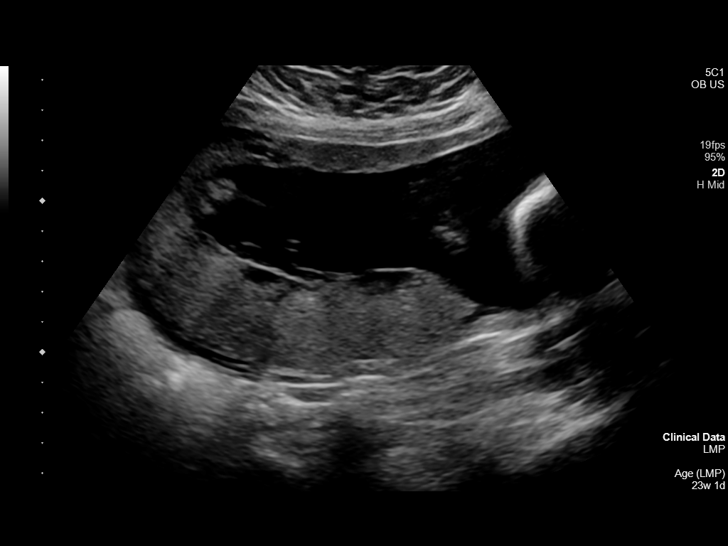
[im 22/54]
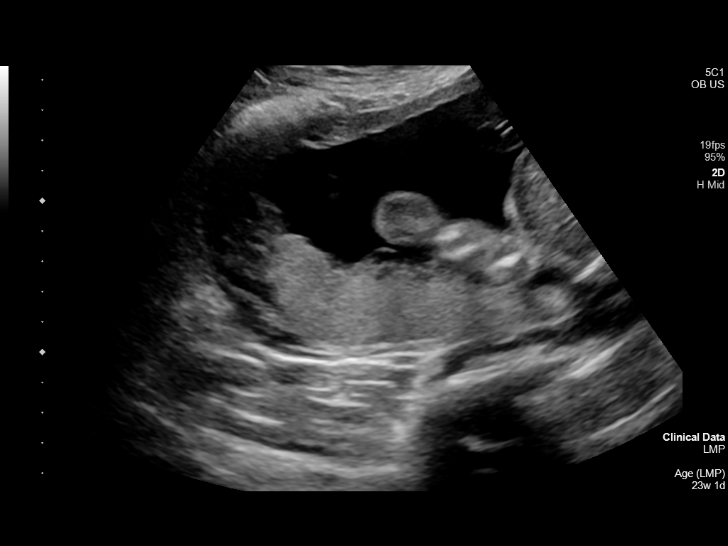
[im 28/54]
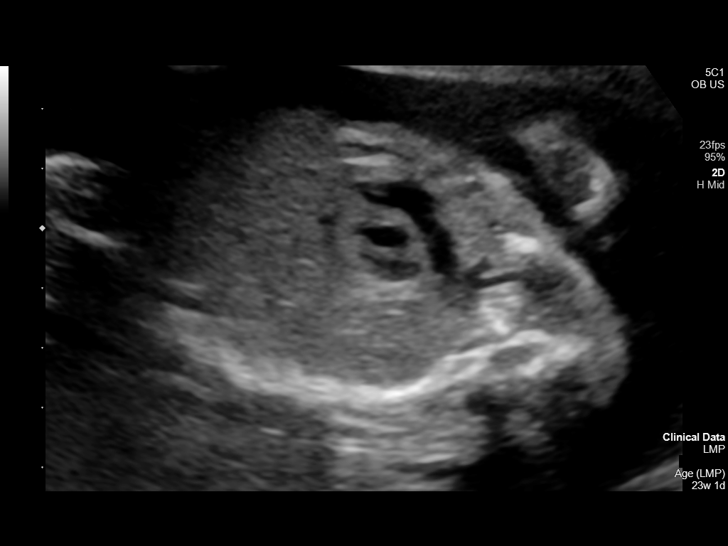
[im 32/54]
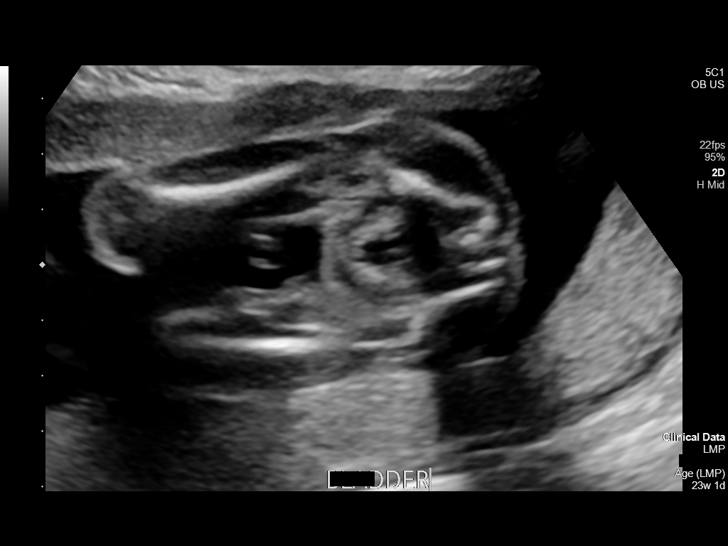
[im 36/54]
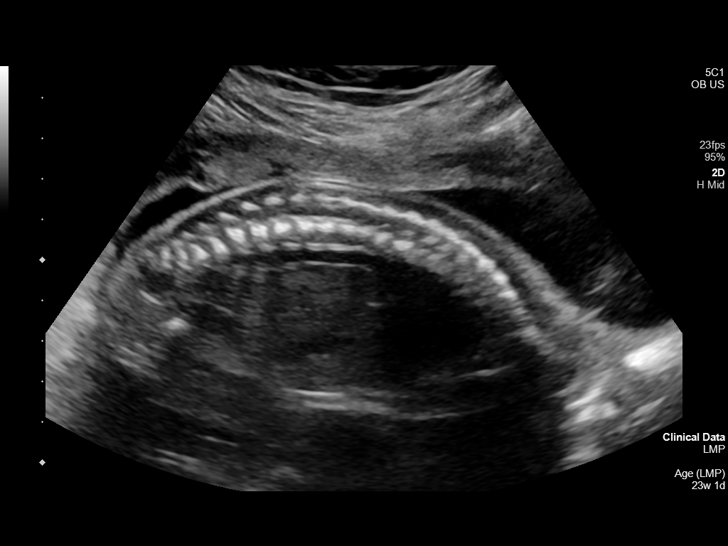
[im 40/54]
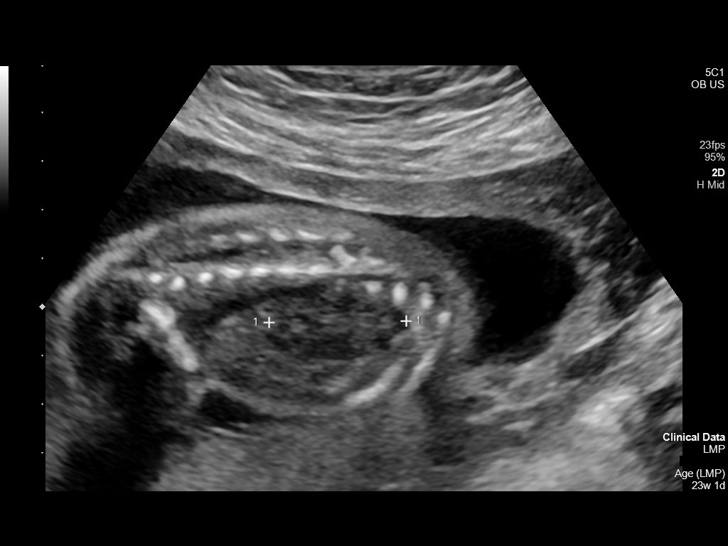
[im 44/54]
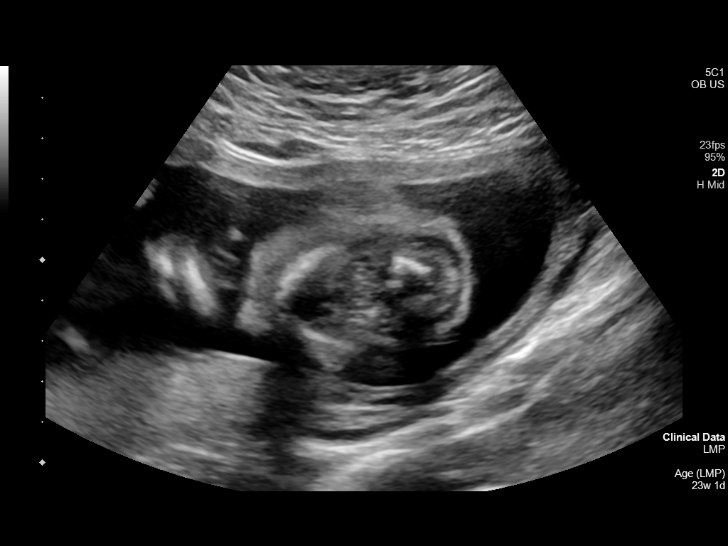
[im 48/54]
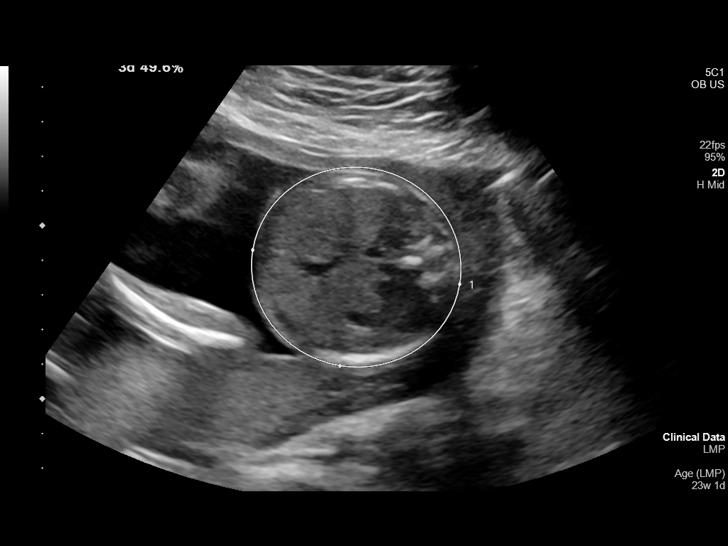
[im 52/54]
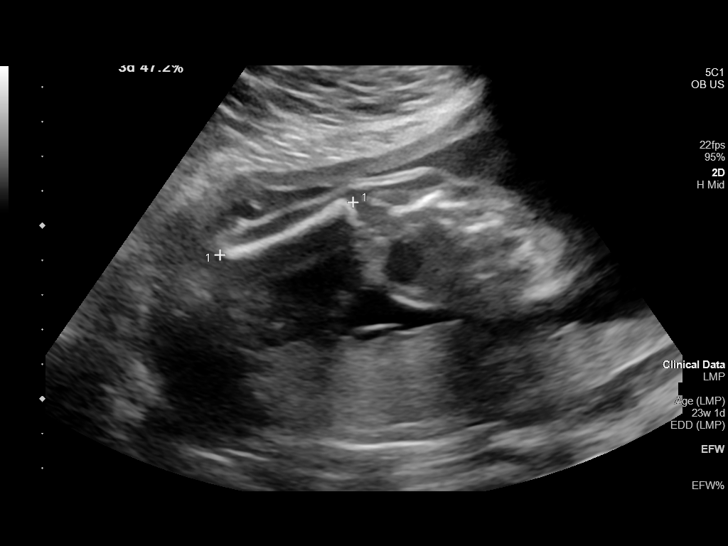

[13 of 28 positions shown; findings below may reference images not displayed]

FINDINGS: Number of Fetuses: 1

Heart Rate:  148 bpm

Movement: Yes

Presentation: Cephalic

Previa: No

Placental Location: Posterior

Amniotic Fluid (Subjective): Within normal limits

Amniotic Fluid (Objective):

Vertical pocket 3.3cm

FETAL BIOMETRY

BPD:  5.8cm 23w 6d

HC:    21.4cm 23w 3d

AC:    18.3cm 23w 1d

FL:    4.1cm 23w 3d

Current Mean GA: 23w 4 d US EDC: 12/04/2021

Assigned GA: 23w 2d Assigned EDC: 12/06/2021

FETAL ANATOMY

Lateral Ventricles: Appears normal

Thalami/CSP: Appears normal

Posterior Fossa: Previously seen

Nuchal Region: Previously seen

Upper Lip: Previously seen

Spine: Appears normal

4 Chamber Heart on Left: Appears normal

LVOT: Appears normal

RVOT: Appears normal

Stomach on Left: Appears normal

3 Vessel Cord: Previously seen

Cord Insertion site: Appears normal

Kidneys: Appears normal

Bladder: Appears normal

Extremities: Previously seen

Sex: Previously Seen

Technical Limitations: Fetal position

Maternal Findings:

Cervix:  3.7 cm TA
IMPRESSION: Assigned GA currently 23 weeks 2 days.  Appropriate fetal growth.

Cardiac outflow tracts were visualized on today's exam. No fetal
anatomic abnormality identified.

## 2023-08-15 DIAGNOSIS — R030 Elevated blood-pressure reading, without diagnosis of hypertension: Secondary | ICD-10-CM | POA: Diagnosis not present

## 2023-09-12 DIAGNOSIS — J019 Acute sinusitis, unspecified: Secondary | ICD-10-CM | POA: Diagnosis not present

## 2023-09-12 DIAGNOSIS — B9689 Other specified bacterial agents as the cause of diseases classified elsewhere: Secondary | ICD-10-CM | POA: Diagnosis not present

## 2024-01-05 DIAGNOSIS — J0100 Acute maxillary sinusitis, unspecified: Secondary | ICD-10-CM | POA: Diagnosis not present

## 2024-01-05 DIAGNOSIS — J302 Other seasonal allergic rhinitis: Secondary | ICD-10-CM | POA: Diagnosis not present

## 2024-01-05 DIAGNOSIS — J029 Acute pharyngitis, unspecified: Secondary | ICD-10-CM | POA: Diagnosis not present

## 2024-01-05 DIAGNOSIS — B349 Viral infection, unspecified: Secondary | ICD-10-CM | POA: Diagnosis not present

## 2024-06-05 ENCOUNTER — Encounter: Payer: Self-pay | Admitting: Internal Medicine

## 2024-06-05 ENCOUNTER — Ambulatory Visit (INDEPENDENT_AMBULATORY_CARE_PROVIDER_SITE_OTHER): Admitting: Internal Medicine

## 2024-06-05 VITALS — BP 114/74 | Ht 62.5 in | Wt 137.8 lb

## 2024-06-05 DIAGNOSIS — B009 Herpesviral infection, unspecified: Secondary | ICD-10-CM | POA: Diagnosis not present

## 2024-06-05 DIAGNOSIS — Z0000 Encounter for general adult medical examination without abnormal findings: Secondary | ICD-10-CM

## 2024-06-05 MED ORDER — VALACYCLOVIR HCL 500 MG PO TABS: 500.0000 mg | ORAL_TABLET | Freq: Two times a day (BID) | ORAL | 0 refills | Status: AC

## 2024-06-05 NOTE — Progress Notes (Signed)
 Subjective:    Patient ID: Sandra Arellano, female    DOB: 31-Dec-1990, 31 y.o.   MRN: [id number]  HPI  Patient presents to clinic today to establish care and for management of the conditions listed below. She would like her annual exam today.  HSV: She denies recent outbreak. She takes valacyclovir  only as needed.   Flu: 06/2018 Tetanus: 10/2021 Covid: never Pap smear: 02/2022, abnormal Dentist: as needed  Diet: She does eat meat. She consumes fruits and veggies. She tries to avoid fried foods. She drinks mostly water. Exercise: Gym 5 days per week   Review of Systems   Past Medical History:  Diagnosis Date   Acne    HSV-2 (herpes simplex virus 2) infection     Current Outpatient Medications  Medication Sig Dispense Refill   acetaminophen  (TYLENOL ) 500 MG tablet Take 2 tablets (1,000 mg total) by mouth every 6 (six) hours. (Patient not taking: Reported on 03/30/2022) 30 tablet 0   budesonide (RHINOCORT AQUA) 32 MCG/ACT nasal spray Place into the nose.     coconut oil OIL Apply 1 application. topically as needed. (Patient not taking: Reported on 01/11/2022)  0   dibucaine (NUPERCAINAL) 1 % OINT Place 1 application. rectally as needed for hemorrhoids. (Patient not taking: Reported on 03/30/2022)     docusate sodium  (COLACE) 100 MG capsule Take 1 capsule (100 mg total) by mouth 2 (two) times daily. (Patient not taking: Reported on 01/11/2022) 10 capsule 0   Drospirenone  (SLYND ) 4 MG TABS Take 4 mg by mouth daily. 28 tablet 12   ibuprofen  (ADVIL ) 600 MG tablet Take 1 tablet (600 mg total) by mouth every 6 (six) hours. (Patient not taking: Reported on 01/11/2022) 30 tablet 0   MAGNESIUM PO Take by mouth. (Patient not taking: Reported on 03/30/2022)     Prenatal MV & Min w/FA-DHA ([employer] PRENATAL GUMMY PO) Take by mouth. (Patient not taking: Reported on 03/30/2022)     simethicone  (MYLICON) 80 MG chewable tablet Chew 1 tablet (80 mg total) by mouth as needed for flatulence. (Patient not taking:  Reported on 01/11/2022) 30 tablet 0   valACYclovir  (VALTREX ) 500 MG tablet TAKE 1 TABLET BY MOUTH TWICE A DAY 60 tablet 5   witch hazel-glycerin  (TUCKS) pad Apply 1 application. topically as needed for hemorrhoids. (Patient not taking: Reported on 01/11/2022) 40 each 12   No current facility-administered medications for this visit.    No Known Allergies  Family History  Problem Relation Age of Onset   Diabetes Maternal Grandmother    Heart disease Maternal Grandmother    Diabetes Maternal Grandfather    COPD Maternal Grandfather    Breast cancer Paternal Grandmother    Diabetes Paternal Grandmother    Diabetes Paternal Grandfather    Kidney disease Paternal Grandfather    Hypertension Father    Ovarian cancer Neg Hx    Colon cancer Neg Hx     Social History   Socioeconomic History   Marital status: Married    Spouse name: Dasie   Number of children: Not on file   Years of education: Not on file   Highest education level: Not on file  Occupational History   Not on file  Tobacco Use   Smoking status: Never   Smokeless tobacco: Never  Vaping Use   Vaping status: Never Used  Substance and Sexual Activity   Alcohol use: Not Currently    Comment: occas   Drug use: No   Sexual activity: Yes  Birth control/protection: Surgical    Comment: BTL  Other Topics Concern   Not on file  Social History Narrative   Not on file   Social Drivers of Health   Financial Resource Strain: Not on file  Food Insecurity: Not on file  Transportation Needs: Not on file  Physical Activity: Not on file  Stress: Not on file  Social Connections: Not on file  Intimate Partner Violence: Not on file     Constitutional: Denies fever, malaise, fatigue, headache or abrupt weight changes.  HEENT: Denies eye pain, eye redness, ear pain, ringing in the ears, wax buildup, runny nose, nasal congestion, bloody nose, or sore throat. Respiratory: Denies difficulty breathing, shortness of breath, cough  or sputum production.   Cardiovascular: Denies chest pain, chest tightness, palpitations or swelling in the hands or feet.  Gastrointestinal: Denies abdominal pain, bloating, constipation, diarrhea or blood in the stool.  GU: Denies urgency, frequency, pain with urination, burning sensation, blood in urine, odor or discharge. Musculoskeletal: Pt reports left knee swelling. Denies decrease in range of motion, difficulty with gait, muscle pain or joint pain.  Skin: Denies redness, rashes, lesions or ulcercations.  Neurological: Denies dizziness, difficulty with memory, difficulty with speech or problems with balance and coordination.  Psych: Denies anxiety, depression, SI/HI.  No other specific complaints in a complete review of systems (except as listed in HPI above).      Objective:   Physical Exam  BP 114/74 (BP Location: Right Arm, Patient Position: Sitting, Cuff Size: Normal)   Ht 5' 2.5 (1.588 m)   Wt 137 lb 12.8 oz (62.5 kg)   LMP 05/22/2024 (Approximate)   BMI 24.80 kg/m   Wt Readings from Last 3 Encounters:  03/30/22 155 lb (70.3 kg)  02/13/22 159 lb (72.1 kg)  01/11/22 163 lb (73.9 kg)    General: Appears her stated age, well developed, well nourished in NAD. Skin: Warm, dry and intact. No rashes, lesions or ulcerations noted. HEENT: Head: normal shape and size; Eyes: sclera white, no icterus, conjunctiva pink, PERRLA and EOMs intact;  Neck:  Neck supple, trachea midline. No masses, lumps or thyromegaly present.  Cardiovascular: Normal rate and rhythm. S1,S2 noted.  No murmur, rubs or gallops noted. No JVD or BLE edema. No carotid bruits noted. Pulmonary/Chest: Normal effort and positive vesicular breath sounds. No respiratory distress. No wheezes, rales or ronchi noted.  Abdomen: Soft and nontender. Normal bowel sounds. Musculoskeletal: Strength 5/5 BUE/BLE. Pes bursitis noted of the left knee.  No difficulty with gait.  Neurological: Alert and oriented. Cranial nerves  II-XII grossly intact. Coordination normal.  Psychiatric: Mood and affect normal. Behavior is normal. Judgment and thought content normal.    BMET    Component Value Date/Time   NA 136 08/01/2020 0845   K 3.7 08/01/2020 0845   CL 105 08/01/2020 0845   CO2 20 (L) 08/01/2020 0845   GLUCOSE 135 (H) 08/01/2020 0845   BUN 7 08/01/2020 0845   CREATININE 0.59 08/01/2020 0845   CALCIUM  8.8 (L) 08/01/2020 0845   GFRNONAA >60 08/01/2020 0845    Lipid Panel  No results found for: CHOL, TRIG, HDL, CHOLHDL, VLDL, LDLCALC  CBC    Component Value Date/Time   WBC 15.3 (H) 12/07/2021 0617   RBC 4.17 12/07/2021 0617   HGB 12.8 12/07/2021 0617   HGB 13.4 09/08/2021 0949   HCT 38.1 12/07/2021 0617   HCT 39.1 09/08/2021 0949   PLT 161 12/07/2021 0617   PLT 156 09/08/2021 0949  MCV 91.4 12/07/2021 0617   MCV 90 09/08/2021 0949   MCH 30.7 12/07/2021 0617   MCHC 33.6 12/07/2021 0617   RDW 12.8 12/07/2021 0617   RDW 11.9 09/08/2021 0949   LYMPHSABS 1.7 09/08/2021 0949   EOSABS 0.1 09/08/2021 0949   BASOSABS 0.1 09/08/2021 0949    Hgb A1C No results found for: HGBA1C          Assessment & Plan:  Preventative health maintenance:  She declines flu shot  Tetanus UTD She declines covid vaccine Advised her to call GYN to schedule her pap smear Encouraged her to consume a balanced diet and exercise regimen Advised her to see a dentist annually Labs from [facility] reviewed  RTC in 1 year for your annual exam  Angeline Laura, NP

## 2024-06-05 NOTE — Patient Instructions (Signed)

## 2024-06-05 NOTE — Assessment & Plan Note (Signed)
 Continue valacyclovir  500 mg BID prn for outbreaks

## 2024-06-22 DIAGNOSIS — M545 Low back pain, unspecified: Secondary | ICD-10-CM | POA: Diagnosis not present

## 2024-08-12 ENCOUNTER — Ambulatory Visit

## 2024-08-12 VITALS — BP 127/82 | HR 64 | Ht 62.0 in | Wt 139.8 lb

## 2024-08-12 DIAGNOSIS — Z3201 Encounter for pregnancy test, result positive: Secondary | ICD-10-CM

## 2024-08-12 DIAGNOSIS — O3680X0 Pregnancy with inconclusive fetal viability, not applicable or unspecified: Secondary | ICD-10-CM

## 2024-08-12 DIAGNOSIS — Z3200 Encounter for pregnancy test, result unknown: Secondary | ICD-10-CM

## 2024-08-12 DIAGNOSIS — N912 Amenorrhea, unspecified: Secondary | ICD-10-CM

## 2024-08-12 LAB — POCT URINE PREGNANCY: Preg Test, Ur: POSITIVE — AB

## 2024-08-12 NOTE — Addendum Note (Signed)
 Addended by: TAFT CAMELIA MATSU on: 08/12/2024 03:00 PM   Modules accepted: Orders

## 2024-08-12 NOTE — Patient Instructions (Signed)
 First Trimester of Pregnancy  The first trimester of pregnancy starts on the first day of your last monthly period until the end of week 13. This is months 1 through 3 of pregnancy. A week after a sperm fertilizes an egg, the egg will implant into the wall of the uterus and begin to develop into a baby. Body changes during your first trimester Your body goes through many changes during pregnancy. The changes usually return to normal after your baby is born. Physical changes Your breasts may grow larger and may hurt. The area around your nipples may get darker. Your periods will stop. Your hair and nails may grow faster. You may pee more often. Health changes You may tire easily. Your gums may bleed and may be sensitive when you brush and floss. You may not feel hungry. You may have heartburn. You may throw up or feel like you may throw up. You may want to eat some foods, but not others. You may have headaches. You may have trouble pooping (constipation). Other changes Your emotions may change from day to day. You may have more dreams. Follow these instructions at home: Medicines Talk to your health care provider if you're taking medicines. Ask if the medicines are safe to take during pregnancy. Your provider may change the medicines that you take. Do not take any medicines unless told to by your provider. Take a prenatal vitamin that has at least 600 micrograms (mcg) of folic acid. Do not use herbal medicines, illegal substances, or medicines that are not approved by your provider. Eating and drinking While you're pregnant your body needs extra food for your growing baby. Talk with your provider about what to eat while pregnant. Activity Most women are able to exercise during pregnancy. Exercises may need to change as your pregnancy goes on. Talk to your provider about your activities and exercise routines. Relieving pain and discomfort Wear a good, supportive bra if your breasts  hurt. Rest with your legs raised if you have leg cramps or low back pain. Safety Wear your seatbelt at all times when you're in a car. Talk to your provider if someone hits you, hurts you, or yells at you. Talk with your provider if you're feeling sad or have thoughts of hurting yourself. Lifestyle Certain things can be harmful while you're pregnant. Follow these rules: Do not use hot tubs, steam rooms, or saunas. Do not douche. Do not use tampons or scented pads. Do not drink alcohol,smoke, vape, or use products with nicotine or tobacco in them. If you need help quitting, talk with your provider. Avoid cat litter boxes and soil used by cats. These things carry germs that can cause harm to your pregnancy and your baby. General instructions Keep all follow-up visits. It helps you and your unborn baby stay as healthy as possible. Write down your questions. Take them to your visits. Your provider will: Talk with you about your overall health. Give you advice or refer you to specialists who can help with different needs, including: Prenatal education classes. Mental health and counseling. Foods and healthy eating. Ask for help if you need help with food. Call your dentist and ask to be seen. Brush your teeth with a soft toothbrush. Floss gently. Where to find more information American Pregnancy Association: americanpregnancy.org [employer]: acog.org Office on [employer] Health: [employer] Contact a health care provider if: You feel dizzy, faint, or have a fever. You vomit or have watery poop (diarrhea) for 2  days or more. You have abnormal discharge or bleeding from your vagina. You have pain when you pee or your pee smells bad. You have cramps, pain, or pressure in your belly area. Get help right away if: You have trouble breathing or chest pain. You have any kind of injury, such as from a fall or a car crash. These symptoms may be an  emergency. Get help right away. Call 911. Do not wait to see if the symptoms will go away. Do not drive yourself to the hospital. This information is not intended to replace advice given to you by your health care provider. Make sure you discuss any questions you have with your health care provider. Document Revised: 05/31/2023 Document Reviewed: 12/29/2022 Elsevier Patient Education  2024 [employer] Inc. Commonly Asked Questions During Pregnancy  Cats: A parasite can be excreted in cat feces.  To avoid exposure you need to have another person empty the little box.  If you must empty the litter box you will need to wear gloves.  Wash your hands after handling your cat.  This parasite can also be found in raw or undercooked meat so this should also be avoided.  Colds, Sore Throats, Flu: Please check your medication sheet to see what you can take for symptoms.  If your symptoms are unrelieved by these medications please call the office.  Dental Work: Most any dental work Agricultural consultant recommends is permitted.  X-rays should only be taken during the first trimester if absolutely necessary.  Your abdomen should be shielded with a lead apron during all x-rays.  Please notify your provider prior to receiving any x-rays.  Novocaine is fine; gas is not recommended.  If your dentist requires a note from [place] prior to dental work please call the office and we will provide one for you.  Exercise: Exercise is an important part of staying healthy during your pregnancy.  You may continue most exercises you were accustomed to prior to pregnancy.  Later in your pregnancy you will most likely notice you have difficulty with activities requiring balance like riding a bicycle.  It is important that you listen to your body and avoid activities that put you at a higher risk of falling.  Adequate rest and staying well hydrated are a must!  If you have questions about the safety of specific activities ask your provider.     Exposure to Children with illness: Try to avoid obvious exposure; report any symptoms to [place] when noted,  If you have chicken pos, red measles or mumps, you should be immune to these diseases.   Please do not take any vaccines while pregnant unless you have checked with your OB provider.  Fetal Movement: After 28 weeks we recommend you do "kick counts" twice daily.  Lie or sit down in a calm quiet environment and count your baby movements "kicks".  You should feel your baby at least 10 times per hour.  If you have not felt 10 kicks within the first hour get up, walk around and have something sweet to eat or drink then repeat for an additional hour.  If count remains less than 10 per hour notify your provider.  Fumigating: Follow your pest control agent's advice as to how long to stay out of your home.  Ventilate the area well before re-entering.  Hemorrhoids:   Most over-the-counter preparations can be used during pregnancy.  Check your medication to see what is safe to use.  It is important to use  a stool softener or fiber in your diet and to drink lots of liquids.  If hemorrhoids seem to be getting worse please call the office.   Hot Tubs:  Hot tubs Jacuzzis and saunas are not recommended while pregnant.  These increase your internal body temperature and should be avoided.  Intercourse:  Sexual intercourse is safe during pregnancy as long as you are comfortable, unless otherwise advised by your provider.  Spotting may occur after intercourse; report any bright red bleeding that is heavier than spotting.  Labor:  If you know that you are in labor, please go to the hospital.  If you are unsure, please call the office and let us help you decide what to do.  Lifting, straining, etc:  If your job requires heavy lifting or straining please check with your provider for any limitations.  Generally, you should not lift items heavier than that you can lift simply with your hands and arms (no back  muscles)  Painting:  Paint fumes do not harm your pregnancy, but may make you ill and should be avoided if possible.  Latex or water based paints have less odor than oils.  Use adequate ventilation while painting.  Permanents & Hair Color:  Chemicals in hair dyes are not recommended as they cause increase hair dryness which can increase hair loss during pregnancy.  " Highlighting" and permanents are allowed.  Dye may be absorbed differently and permanents may not hold as well during pregnancy.  Sunbathing:  Use a sunscreen, as skin burns easily during pregnancy.  Drink plenty of fluids; avoid over heating.  Tanning Beds:  Because their possible side effects are still unknown, tanning beds are not recommended.  Ultrasound Scans:  Routine ultrasounds are performed at approximately 20 weeks.  You will be able to see your baby's general anatomy an if you would like to know the gender this can usually be determined as well.  If it is questionable when you conceived you may also receive an ultrasound early in your pregnancy for dating purposes.  Otherwise ultrasound exams are not routinely performed unless there is a medical necessity.  Although you can request a scan we ask that you pay for it when conducted because insurance does not cover " patient request" scans.  Work: If your pregnancy proceeds without complications you may work until your due date, unless your physician or employer advises otherwise.  Round Ligament Pain/Pelvic Discomfort:  Sharp, shooting pains not associated with bleeding are fairly common, usually occurring in the second trimester of pregnancy.  They tend to be worse when standing up or when you remain standing for long periods of time.  These are the result of pressure of certain pelvic ligaments called "round ligaments".  Rest, Tylenol and heat seem to be the most effective relief.  As the womb and fetus grow, they rise out of the pelvis and the discomfort improves.  Please  notify the office if your pain seems different than that described.  It may represent a more serious condition.  Common Medications Safe in Pregnancy  Acne:      Constipation:  Benzoyl Peroxide     Colace  Clindamycin      Dulcolax Suppository  Topica Erythromycin     Fibercon  Salicylic Acid      Metamucil         Miralax AVOID:        Senakot   Accutane    Cough:  Retin-A  Cough Drops  Tetracycline      Phenergan w/ Codeine if Rx  Minocycline      Robitussin (Plain & DM)  Antibiotics:     Crabs/Lice:  Ceclor       RID  Cephalosporins    AVOID:  E-Mycins      Kwell  Keflex  Macrobid/Macrodantin   Diarrhea:  Penicillin      Kao-Pectate  Zithromax      Imodium AD         PUSH FLUIDS AVOID:       Cipro     Fever:  Tetracycline      Tylenol (Regular or Extra  Minocycline       Strength)  Levaquin      Extra Strength-Do not          Exceed 8 tabs/24 hrs Caffeine:        200mg /day (equiv. To 1 cup of coffee or  approx. 3 12 oz sodas)         Gas: Cold/Hayfever:       Gas-X  Benadryl      Mylicon  Claritin       Phazyme  **Claritin-D        Chlor-Trimeton    Headaches:  Dimetapp      ASA-Free Excedrin  Drixoral-Non-Drowsy     Cold Compress  Mucinex (Guaifenasin)     Tylenol (Regular or Extra  Sudafed/Sudafed-12 Hour     Strength)  **Sudafed PE Pseudoephedrine   Tylenol Cold & Sinus     Vicks Vapor Rub  Zyrtec  **AVOID if Problems With Blood Pressure         Heartburn: Avoid lying down for at least 1 hour after meals  Aciphex      Maalox     Rash:  Milk of Magnesia     Benadryl    Mylanta       1% Hydrocortisone Cream  Pepcid  Pepcid Complete   Sleep Aids:  Prevacid      Ambien   Prilosec       Benadryl  Rolaids       Chamomile Tea  Tums (Limit 4/day)     Unisom         Tylenol PM         Warm milk-add vanilla or  Hemorrhoids:       Sugar for taste  Anusol/Anusol H.C.  (RX: Analapram 2.5%)  Sugar Substitutes:  Hydrocortisone OTC     Ok in  moderation  Preparation H      Tucks        Vaseline lotion applied to tissue with wiping    Herpes:     Throat:  Acyclovir      Oragel  Famvir  Valtrex     Vaccines:         Flu Shot Leg Cramps:       *Gardasil  Benadryl      Hepatitis A         Hepatitis B Nasal Spray:       Pneumovax  Saline Nasal Spray     Polio Booster         Tetanus Nausea:       Tuberculosis test or PPD  Vitamin B6 25 mg TID   AVOID:    Dramamine      *Gardasil  Emetrol       Live Poliovirus  Ginger Root 250 mg QID    MMR (measles, mumps &  High Complex Carbs @ Bedtime    rebella)  Sea Bands-Accupressure    Varicella (Chickenpox)  Unisom 1/2 tab TID     *No known complications           If received before Pain:         Known pregnancy;   Darvocet       Resume series after  Lortab        Delivery  Percocet    Yeast:   Tramadol      Femstat  Tylenol 3      Gyne-lotrimin  Ultram       Monistat  Vicodin           MISC:         All Sunscreens           Hair Coloring/highlights          Insect Repellant's          (Including DEET)         Mystic Tans   Morning Sickness Morning sickness is when you throw up or feel like you may throw up during pregnancy. This condition often occurs in the morning, but it can also occur at any time of day. Morning sickness is most common during the first three months of pregnancy, but it can go on throughout the pregnancy. Morning sickness is usually harmless. But if you throw up all the time, you should see your health care provider. You may also hear this condition called nausea and vomiting of pregnancy. What are the causes? The cause of morning sickness is not known. It may be linked to changes in hormones during pregnancy. What increases the risk? You're more likely to have morning sickness if: You had morning sickness in another pregnancy. You're pregnant with more than one baby, such as twins. You had morning sickness in other pregnancies. You have had  motion sickness before you were pregnant. You have had bad headaches or migraines before you were pregnant. What are the signs or symptoms? Symptoms of morning sickness include: Feeling like you may throw up. Throwing up. How is this diagnosed? Morning sickness is diagnosed based on your symptoms. How is this treated? Treatment is usually not needed for morning sickness. You may only need to change what you eat. In some cases, your provider may give you: Vitamin B6 supplements. Medicines to prevent throwing up. Ginger. Follow these instructions at home: Medicines Take your medicines only as told by your provider. Do not use any prescription, over-the-counter, or herbal medicines for morning sickness without first talking with your provider. Take prenatal vitamins. These can stop or lessen the symptoms of morning sickness. If you feel like you may throw up after taking prenatal vitamins, take them at night or with a snack. Eating and drinking     Eat dry toast or crackers before getting out of bed. Eat 5 or 6 small meals a day. Try ginger ale made with real ginger, ginger tea, or ginger candies. Drink fluids throughout the day. Eat protein foods when you need a snack. Nuts, yogurt, and cheese are good choices. Eat dry and bland foods like rice or baked potatoes. Foods that are high in carbohydrates are often helpful. Have someone cook for you if the smell of food makes you want to throw up. Foods to avoid Greasy foods. Fatty foods. Spicy foods. General instructions Try to avoid smells that make you feel sick. Use an air purifier to keep the air in your  house free of smells. Try using an acupressure wristband. This is a wristband that's used to treat motion sickness. Try acupuncture. In this treatment, a provider puts thin needles into certain areas of your body to make you feel better. Brush your teeth after throwing up or rinse with a mix of baking soda and water. The acid in  throw-up can hurt your teeth. Contact a health care provider if: Your symptoms do not get better. You feel dizzy or light-headed. You're losing weight. Get help right away if: The feeling that you may throw up will not go away, or you can't stop throwing up. You faint. You have very bad pain in your belly. This information is not intended to replace advice given to you by your health care provider. Make sure you discuss any questions you have with your health care provider. Document Revised: 05/31/2023 Document Reviewed: 12/07/2022 Elsevier Patient Education  2024 [employer].

## 2024-08-12 NOTE — Progress Notes (Signed)
    NURSE VISIT NOTE  Subjective:    Patient ID: Sandra Arellano, female    DOB: 03-Aug-1991, 33 y.o.   MRN: [id number]  HPI  Patient is a 33 y.o. G53P2002 female who presents for evaluation of amenorrhea. She believes she could be pregnant. Pregnancy is desired. Sexual Activity: single partner, contraception: none. Current symptoms also include: nausea and positive home pregnancy test. Last period was normal.    Objective:    BP 127/82   Pulse 64   Ht 5' 2 (1.575 m)   Wt 139 lb 12.8 oz (63.4 kg)   LMP 06/21/2024   BMI 25.57 kg/m   Lab Review  Results for orders placed or performed in visit on 08/12/24  POCT urine pregnancy  Result Value Ref Range   Preg Test, Ur Positive (A) Negative    Assessment:   1. Amenorrhea   2. Possible pregnancy, not yet confirmed     Plan:   Pregnancy Test: Positive  Estimated Date of Delivery: 03/28/25 BP Cuff Measurement taken. Cuff Size Adult Small Encouraged well-balanced diet, plenty of rest when needed, pre-natal vitamins daily and walking for exercise.  Discussed self-help for nausea, avoiding OTC medications until consulting provider or pharmacist, other than Tylenol  as needed, minimal caffeine (1-2 cups daily) and avoiding alcohol.   She will schedule her nurse visit @ 7-[redacted] wks pregnant, u/s for dating @10  wk, and NOB visit at [redacted] wk pregnant.    Feel free to call with any questions.     Camelia Bars, LPN

## 2024-08-25 ENCOUNTER — Telehealth

## 2024-08-25 DIAGNOSIS — Z3480 Encounter for supervision of other normal pregnancy, unspecified trimester: Secondary | ICD-10-CM | POA: Insufficient documentation

## 2024-08-25 NOTE — Progress Notes (Signed)
 New OB Intake  I connected with  Sandra Arellano on 08/25/2024 at 10:15 AM EST by MyChart Video Visit and verified that I am speaking with the correct person using two identifiers. Nurse is located at [facility] and pt is located at Home.  I discussed the limitations, risks, security and privacy concerns of performing an evaluation and management service by telephone and the availability of in person appointments. I also discussed with the patient that there may be a patient responsible charge related to this service. The patient expressed understanding and agreed to proceed.  I explained I am completing New OB Intake today. We discussed her EDD of 03/28/25 that is based on LMP of 06/21/25. Pt is G3/P2002. I reviewed her allergies, medications, Medical/Surgical/OB history, and appropriate screenings. There are cats in the home: no. Based on history, this is a/an pregnancy uncomplicated . Her obstetrical history is significant for NA.  Patient Active Problem List   Diagnosis Date Noted   Herpes 06/06/2019    Concerns addressed today: No concerns today.  Delivery Plans:  Plans to deliver at [facility].  Anatomy US  Explained first scheduled US  will be 09/01/24. Anatomy US  will be scheduled around [redacted] weeks gestational age.  Labs Discussed genetic screening with patient. Patient consents to genetic testing to be drawn at new OB visit. Discussed possible labs to be drawn at new OB appointment.  COVID Vaccine Patient has not had COVID vaccine.   Social Determinants of Health Food Insecurity: denies food insecurity Transportation: Patient expressed transportation needs. Childcare: Discussed no children allowed at ultrasound appointments.   First visit review I reviewed new OB appt with pt. I explained she will have blood work and pap smear/pelvic exam if indicated. Explained pt will be seen by Jinnie Cookey, CNM at first visit; encounter routed to appropriate provider.    Mathis LITTIE Getting, CMA 08/25/2024  10:42 AM

## 2024-08-25 NOTE — Patient Instructions (Addendum)
 First Trimester of Pregnancy  The first trimester of pregnancy starts on the first day of your last monthly period until the end of week 13. This is months 1 through 3 of pregnancy. A week after a sperm fertilizes an egg, the egg will implant into the wall of the uterus and begin to develop into a baby. Body changes during your first trimester Your body goes through many changes during pregnancy. The changes usually return to normal after your baby is born. Physical changes Your breasts may grow larger and may hurt. The area around your nipples may get darker. Your periods will stop. Your hair and nails may grow faster. You may pee more often. Health changes You may tire easily. Your gums may bleed and may be sensitive when you brush and floss. You may not feel hungry. You may have heartburn. You may throw up or feel like you may throw up. You may want to eat some foods, but not others. You may have headaches. You may have trouble pooping (constipation). Other changes Your emotions may change from day to day. You may have more dreams. Follow these instructions at home: Medicines Talk to your health care provider if you're taking medicines. Ask if the medicines are safe to take during pregnancy. Your provider may change the medicines that you take. Do not take any medicines unless told to by your provider. Take a prenatal vitamin that has at least 600 micrograms (mcg) of folic acid. Do not use herbal medicines, illegal substances, or medicines that are not approved by your provider. Eating and drinking While you're pregnant your body needs extra food for your growing baby. Talk with your provider about what to eat while pregnant. Activity Most women are able to exercise during pregnancy. Exercises may need to change as your pregnancy goes on. Talk to your provider about your activities and exercise routines. Relieving pain and discomfort Wear a good, supportive bra if your breasts  hurt. Rest with your legs raised if you have leg cramps or low back pain. Safety Wear your seatbelt at all times when you're in a car. Talk to your provider if someone hits you, hurts you, or yells at you. Talk with your provider if you're feeling sad or have thoughts of hurting yourself. Lifestyle Certain things can be harmful while you're pregnant. Follow these rules: Do not use hot tubs, steam rooms, or saunas. Do not douche. Do not use tampons or scented pads. Do not drink alcohol,smoke, vape, or use products with nicotine or tobacco in them. If you need help quitting, talk with your provider. Avoid cat litter boxes and soil used by cats. These things carry germs that can cause harm to your pregnancy and your baby. General instructions Keep all follow-up visits. It helps you and your unborn baby stay as healthy as possible. Write down your questions. Take them to your visits. Your provider will: Talk with you about your overall health. Give you advice or refer you to specialists who can help with different needs, including: Prenatal education classes. Mental health and counseling. Foods and healthy eating. Ask for help if you need help with food. Call your dentist and ask to be seen. Brush your teeth with a soft toothbrush. Floss gently. Where to find more information American Pregnancy Association: americanpregnancy.org [employer]: acog.org Office on [employer] Health: [employer] Contact a health care provider if: You feel dizzy, faint, or have a fever. You vomit or have watery poop (diarrhea) for 2  days or more. You have abnormal discharge or bleeding from your vagina. You have pain when you pee or your pee smells bad. You have cramps, pain, or pressure in your belly area. Get help right away if: You have trouble breathing or chest pain. You have any kind of injury, such as from a fall or a car crash. These symptoms may be an  emergency. Get help right away. Call 911. Do not wait to see if the symptoms will go away. Do not drive yourself to the hospital. This information is not intended to replace advice given to you by your health care provider. Make sure you discuss any questions you have with your health care provider. Document Revised: 05/31/2023 Document Reviewed: 12/29/2022 Elsevier Patient Education  2024 Elsevier Inc.   Common Medications Safe in Pregnancy  Acne:      Constipation:  Benzoyl Peroxide     Colace  Clindamycin      Dulcolax Suppository  Topica Erythromycin     Fibercon  Salicylic Acid      Metamucil         Miralax AVOID:        Senakot   Accutane    Cough:  Retin-A       Cough Drops  Tetracycline      Phenergan w/ Codeine if Rx  Minocycline      Robitussin (Plain & DM)  Antibiotics:     Crabs/Lice:  Ceclor       RID  Cephalosporins    AVOID:  E-Mycins      Kwell  Keflex  Macrobid/Macrodantin   Diarrhea:  Penicillin      Kao-Pectate  Zithromax      Imodium AD         PUSH FLUIDS AVOID:       Cipro     Fever:  Tetracycline      Tylenol (Regular or Extra  Minocycline       Strength)  Levaquin      Extra Strength-Do not          Exceed 8 tabs/24 hrs Caffeine:        200mg /day (equiv. To 1 cup of coffee or  approx. 3 12 oz sodas)         Gas: Cold/Hayfever:       Gas-X  Benadryl      Mylicon  Claritin       Phazyme  **Claritin-D        Chlor-Trimeton    Headaches:  Dimetapp      ASA-Free Excedrin  Drixoral-Non-Drowsy     Cold Compress  Mucinex (Guaifenasin)     Tylenol (Regular or Extra  Sudafed/Sudafed-12 Hour     Strength)  **Sudafed PE Pseudoephedrine   Tylenol Cold & Sinus     Vicks Vapor Rub  Zyrtec  **AVOID if Problems With Blood Pressure         Heartburn: Avoid lying down for at least 1 hour after meals  Aciphex      Maalox     Rash:  Milk of Magnesia     Benadryl    Mylanta       1% Hydrocortisone Cream  Pepcid  Pepcid Complete   Sleep  Aids:  Prevacid      Ambien   Prilosec       Benadryl  Rolaids       Chamomile Tea  Tums (Limit 4/day)     Unisom  Tylenol PM         Warm milk-add vanilla or  Hemorrhoids:       Sugar for taste  Anusol/Anusol H.C.  (RX: Analapram 2.5%)  Sugar Substitutes:  Hydrocortisone OTC     Ok in moderation  Preparation H      Tucks        Vaseline lotion applied to tissue with wiping    Herpes:     Throat:  Acyclovir      Oragel  Famvir  Valtrex     Vaccines:         Flu Shot Leg Cramps:       *Gardasil  Benadryl      Hepatitis A         Hepatitis B Nasal Spray:       Pneumovax  Saline Nasal Spray     Polio Booster         Tetanus Nausea:       Tuberculosis test or PPD  Vitamin B6 25 mg TID   AVOID:    Dramamine      *Gardasil  Emetrol       Live Poliovirus  Ginger Root 250 mg QID    MMR (measles, mumps &  High Complex Carbs @ Bedtime    rebella)  Sea Bands-Accupressure    Varicella (Chickenpox)  Unisom 1/2 tab TID     *No known complications           If received before Pain:         Known pregnancy;   Darvocet       Resume series after  Lortab        Delivery  Percocet    Yeast:   Tramadol      Femstat  Tylenol 3      Gyne-lotrimin  Ultram       Monistat  Vicodin           MISC:         All Sunscreens           Hair Coloring/highlights          Insect Repellant's          (Including DEET)         Mystic Tans   Commonly Asked Questions During Pregnancy   Cats: A parasite can be excreted in cat feces.  To avoid exposure you need to have another person empty the little box.  If you must empty the litter box you will need to wear gloves.  Wash your hands after handling your cat.  This parasite can also be found in raw or undercooked meat so this should also be avoided.  Colds, Sore Throats, Flu: Please check your medication sheet to see what you can take for symptoms.  If your symptoms are unrelieved by these medications please call the office.  Dental Work: Most  any dental work Agricultural consultant recommends is permitted.  X-rays should only be taken during the first trimester if absolutely necessary.  Your abdomen should be shielded with a lead apron during all x-rays.  Please notify your provider prior to receiving any x-rays.  Novocaine is fine; gas is not recommended.  If your dentist requires a note from [place] prior to dental work please call the office and we will provide one for you.  Exercise: Exercise is an important part of staying healthy during your pregnancy.  You may continue most exercises you were accustomed to prior to pregnancy.  Later in your pregnancy you will most likely notice you have difficulty with activities requiring balance like riding a bicycle.  It is important that you listen to your body and avoid activities that put you at a higher risk of falling.  Adequate rest and staying well hydrated are a must!  If you have questions about the safety of specific activities ask your provider.    Exposure to Children with illness: Try to avoid obvious exposure; report any symptoms to [place] when noted,  If you have chicken pos, red measles or mumps, you should be immune to these diseases.   Please do not take any vaccines while pregnant unless you have checked with your OB provider.  Fetal Movement: After 28 weeks we recommend you do "kick counts" twice daily.  Lie or sit down in a calm quiet environment and count your baby movements "kicks".  You should feel your baby at least 10 times per hour.  If you have not felt 10 kicks within the first hour get up, walk around and have something sweet to eat or drink then repeat for an additional hour.  If count remains less than 10 per hour notify your provider.  Fumigating: Follow your pest control agent's advice as to how long to stay out of your home.  Ventilate the area well before re-entering.  Hemorrhoids:   Most over-the-counter preparations can be used during pregnancy.  Check your medication to see what is  safe to use.  It is important to use a stool softener or fiber in your diet and to drink lots of liquids.  If hemorrhoids seem to be getting worse please call the office.   Hot Tubs:  Hot tubs Jacuzzis and saunas are not recommended while pregnant.  These increase your internal body temperature and should be avoided.  Intercourse:  Sexual intercourse is safe during pregnancy as long as you are comfortable, unless otherwise advised by your provider.  Spotting may occur after intercourse; report any bright red bleeding that is heavier than spotting.  Labor:  If you know that you are in labor, please go to the hospital.  If you are unsure, please call the office and let us help you decide what to do.  Lifting, straining, etc:  If your job requires heavy lifting or straining please check with your provider for any limitations.  Generally, you should not lift items heavier than that you can lift simply with your hands and arms (no back muscles)  Painting:  Paint fumes do not harm your pregnancy, but may make you ill and should be avoided if possible.  Latex or water based paints have less odor than oils.  Use adequate ventilation while painting.  Permanents & Hair Color:  Chemicals in hair dyes are not recommended as they cause increase hair dryness which can increase hair loss during pregnancy.  " Highlighting" and permanents are allowed.  Dye may be absorbed differently and permanents may not hold as well during pregnancy.  Sunbathing:  Use a sunscreen, as skin burns easily during pregnancy.  Drink plenty of fluids; avoid over heating.  Tanning Beds:  Because their possible side effects are still unknown, tanning beds are not recommended.  Ultrasound Scans:  Routine ultrasounds are performed at approximately 20 weeks.  You will be able to see your baby's general anatomy an if you would like to know the gender this can usually be determined as well.  If it is questionable when you conceived you may  also  receive an ultrasound early in your pregnancy for dating purposes.  Otherwise ultrasound exams are not routinely performed unless there is a medical necessity.  Although you can request a scan we ask that you pay for it when conducted because insurance does not cover " patient request" scans.  Work: If your pregnancy proceeds without complications you may work until your due date, unless your physician or employer advises otherwise.  Round Ligament Pain/Pelvic Discomfort:  Sharp, shooting pains not associated with bleeding are fairly common, usually occurring in the second trimester of pregnancy.  They tend to be worse when standing up or when you remain standing for long periods of time.  These are the result of pressure of certain pelvic ligaments called "round ligaments".  Rest, Tylenol and heat seem to be the most effective relief.  As the womb and fetus grow, they rise out of the pelvis and the discomfort improves.  Please notify the office if your pain seems different than that described.  It may represent a more serious condition.

## 2024-09-01 ENCOUNTER — Other Ambulatory Visit

## 2024-09-01 DIAGNOSIS — O3680X0 Pregnancy with inconclusive fetal viability, not applicable or unspecified: Secondary | ICD-10-CM | POA: Diagnosis not present

## 2024-09-01 DIAGNOSIS — Z3A11 11 weeks gestation of pregnancy: Secondary | ICD-10-CM

## 2024-09-15 NOTE — Progress Notes (Signed)
 NEW OB HISTORY AND PHYSICAL  SUBJECTIVE:       Sandra Arellano is a 34 y.o. G20P2002 female, Patient's last menstrual period was 06/21/2024., Estimated Date of Delivery: 03/28/25, [redacted]w[redacted]d, presents today for establishment of Prenatal Care. Was not using contraception  when she conceived, she is happy to be pregnant  She reports nausea uses ginger drops, nausea goes away after eating , was getting HA but they are improving, fatigue ie improving    HSV: last outbreak October 2025, takes medicine episodic  Spinal injury from working out, recovering with PT   Gained 56 and 20+ pounds in last pregnancy, breastfed x 3months, had PPD with first child   Social history Partner/Relationship:husband  Living situation: Husband and 2 kids Russella  4 and  [place] 2  Work:SAHM  Exercise: yoga, light weights, walking, StairMaster  Substance use: denies all   Indications for ASA therapy (per uptodate) One of the following: Previous pregnancy with preeclampsia, especially early onset and with an adverse outcome No Multifetal gestation No Chronic hypertension No Type 1 or 2 diabetes mellitus No Chronic kidney disease No Autoimmune disease (antiphospholipid syndrome, systemic lupus erythematosus) No  Two or more of the following: Nulliparity No Obesity (body mass index >30 kg/m2) No Family history of preeclampsia in mother or sister No Age >=35 years No Sociodemographic characteristics (African American race, low socioeconomic level) No Personal risk factors (eg, previous pregnancy with low birth weight or small for gestational age infant, previous adverse pregnancy outcome [eg, stillbirth], interval >10 years between pregnancies) No  Indications for early GDM screening  First-degree relative with diabetes No BMI >30kg/m2 No Age > 35 No Previous birth of an infant weighing >=4000 g No Gestational diabetes mellitus in a previous pregnancy No Glycated hemoglobin >=5.7 percent (39 mmol/mol),  impaired glucose tolerance, or impaired fasting glucose on previous testing No High-risk race/ethnicity (eg, African American, Latino, Native American, Asian American, Pacific Islander) No Previous stillbirth of unknown cause No Maternal birthweight > 9 lbs No History of cardiovascular disease No Hypertension or on therapy for hypertension No High-density lipoprotein cholesterol level <35 mg/dL (9.09 mmol/L) and/or a triglyceride level >250 mg/dL (7.17 mmol/L) No Polycystic ovary syndrome No Physical inactivity No Other clinical condition associated with insulin resistance (eg, severe obesity, acanthosis nigricans) No Current use of glucocorticoids No   Early screening tests: FBS, A1C, Random CBG, glucose challenge  Gynecologic History Patient's last menstrual period was 06/21/2024. Normal Contraception: none  Colpo 2023CIN1  Last Pap:     Component Value Date/Time   DIAGPAP (A) 01/11/2022 1215    - High grade squamous intraepithelial lesion (HSIL)   DIAGPAP  08/05/2019 1459    - Negative for intraepithelial lesion or malignancy (NILM)   ADEQPAP  01/11/2022 1215    Satisfactory for evaluation; transformation zone component PRESENT.   ADEQPAP  08/05/2019 1459    Satisfactory for evaluation; transformation zone component PRESENT.  SABRA Results were: abnormal  Obstetric History OB History  Gravida Para Term Preterm AB Living  3 2 2  0 0 2  SAB IAB Ectopic Multiple Live Births  0 0 0 0 2    # Outcome Date GA Lbr Len/2nd Weight Sex Type Anes PTL Lv  3 Current           2 Term 12/06/21 [redacted]w[redacted]d 01:10 / 00:30 7 lb 12.5 oz (3.53 kg) M Vag-Spont EPI  LIV  1 Term 08/02/20 [redacted]w[redacted]d 08:05 / 03:03 8 lb 8.9 oz (3.88 kg) M Vag-Spont EPI  LIV  Past Medical History:  Diagnosis Date   Acne    HSV-2 (herpes simplex virus 2) infection     Past Surgical History:  Procedure Laterality Date   NO PAST SURGERIES      Medications Ordered Prior to Encounter[1]  Allergies[2]  Social History    Socioeconomic History   Marital status: Married    Spouse name: Educational Psychologist   Number of children: Not on file   Years of education: Not on file   Highest education level: Not on file  Occupational History   Not on file  Tobacco Use   Smoking status: Never   Smokeless tobacco: Never  Vaping Use   Vaping status: Never Used  Substance and Sexual Activity   Alcohol use: Not Currently    Comment: social   Drug use: Never   Sexual activity: Yes    Partners: Male    Comment: BTL  Other Topics Concern   Not on file  Social History Narrative   Not on file   Social Drivers of Health   Tobacco Use: Low Risk (08/25/2024)   Patient History    Smoking Tobacco Use: Never    Smokeless Tobacco Use: Never    Passive Exposure: Not on file  Financial Resource Strain: Not on file  Food Insecurity: No Food Insecurity (08/25/2024)   Epic    Worried About Programme Researcher, Broadcasting/film/video in the Last Year: Never true    Ran Out of Food in the Last Year: Never true  Transportation Needs: No Transportation Needs (08/25/2024)   Epic    Lack of Transportation (Medical): No    Lack of Transportation (Non-Medical): No  Physical Activity: Not on file  Stress: Not on file  Social Connections: Not on file  Intimate Partner Violence: Not on file  Depression (PHQ2-9): Low Risk (06/05/2024)   Depression (PHQ2-9)    PHQ-2 Score: 2  Alcohol Screen: Not on file  Housing: Low Risk (08/25/2024)   Epic    Unable to Pay for Housing in the Last Year: No    Number of Times Moved in the Last Year: 0    Homeless in the Last Year: No  Utilities: Not At Risk (08/25/2024)   Epic    Threatened with loss of utilities: No  Health Literacy: Not on file    Family History  Problem Relation Age of Onset   Depression Mother    Hypertension Father    Diabetes Maternal Grandmother    Heart disease Maternal Grandmother    Diabetes Maternal Grandfather    COPD Maternal Grandfather    Breast cancer Paternal Grandmother     Diabetes Paternal Grandmother    Diabetes Paternal Grandfather    Kidney disease Paternal Grandfather    Ovarian cancer Neg Hx    Colon cancer Neg Hx     The following portions of the patient's history were reviewed and updated as appropriate: allergies, current medications, past OB history, past medical history, past surgical history, past family history, past social history, and problem list.  Constitutional: Denied constitutional symptoms, night sweats, recent illness, fatigue, fever, insomnia and weight loss.  Eyes: Denied eye symptoms, eye pain, photophobia, vision change and visual disturbance.  Ears/Nose/Throat/Neck: Denied ear, nose, throat or neck symptoms, hearing loss, nasal discharge, sinus congestion and sore throat.  Cardiovascular: Denied cardiovascular symptoms, arrhythmia, chest pain/pressure, edema, exercise intolerance, orthopnea and palpitations.  Respiratory: Denied pulmonary symptoms, asthma, pleuritic pain, productive sputum, cough, dyspnea and wheezing.  Gastrointestinal: Denied gastro-esophageal reflux, melena, and  vomiting. Some nausea   Genitourinary: Denied genitourinary symptoms including symptomatic vaginal discharge, pelvic relaxation issues, and urinary complaints.  Musculoskeletal: Denied musculoskeletal symptoms, stiffness, swelling, muscle weakness and myalgia.  Dermatologic: Denied dermatology symptoms, rash and scar.  Neurologic: Denied neurology symptoms, dizziness, headache, neck pain and syncope.  Psychiatric: Denied psychiatric symptoms, anxiety and depression.  Endocrine: Denied endocrine symptoms including hot flashes and night sweats.     OBJECTIVE: Initial Physical Exam (New OB) BP 115/85   Pulse (!) 106   Wt 140 lb 11.2 oz (63.8 kg)   LMP 06/21/2024   BMI 25.73 kg/m  Physical Exam Constitutional:      Appearance: Normal appearance.  Cardiovascular:     Rate and Rhythm: Normal rate and regular rhythm.     Pulses: Normal pulses.      Heart sounds: Normal heart sounds.  Pulmonary:     Effort: Pulmonary effort is normal.     Breath sounds: Normal breath sounds.  Chest:     Comments: Breasts: no redness or masses, nipples intact bilaterally  Abdominal:     General: Abdomen is flat.     Comments: Fetal heart tones 150's   Musculoskeletal:     Cervical back: Normal range of motion.  Skin:    General: Skin is warm.  Neurological:     General: No focal deficit present.     Mental Status: She is alert.  Psychiatric:        Thought Content: Thought content normal.     Fetal Heart Rate (bpm): 150  ASSESSMENT: Normal pregnancy   PLAN: Routine prenatal care. We discussed an overview of prenatal care and when to call. Reviewed diet, exercise, and weight gain recommendations in pregnancy. Discussed benefits of breastfeeding and lactation resources at [facility]. I reviewed labs and answered all questions.  1. Encounter for supervision of other normal pregnancy in first trimester (Primary) - NOB Panel - Culture, OB Urine - Monitor Drug Profile 14(MW) - Nicotine screen, urine - Urinalysis, Routine w reflex microscopic - Hemoglobin A1c - Hgb Fractionation Cascade - Cervicovaginal ancillary only - MaterniT21 PLUS Core - US  OB Comp + 14 Wk; Future  2. [redacted] weeks gestation of pregnancy - NOB Panel - Culture, OB Urine - Monitor Drug Profile 14(MW) - Nicotine screen, urine - Urinalysis, Routine w reflex microscopic - Hemoglobin A1c - Hgb Fractionation Cascade - Cervicovaginal ancillary only - MaterniT21 PLUS Core  3. Genetic screening - MaterniT21 PLUS Core  4. Encounter for drug screening - Monitor Drug Profile 14(MW)  5. Screening examination for STD (sexually transmitted disease) - Cervicovaginal ancillary only  6. Encounter for fetal ultrasound - US  OB Comp + 14 Wk; Future   Did not discuss Pap hx during visit, Pt called, pt has colpo in 2023 CIN1, she did not have any follow up since then. Will repeat pap  at next ROB, pt in agreement.    Majestic Brister M Aldo Sondgeroth, CNM      [1]  Current Outpatient Medications on File Prior to Visit  Medication Sig Dispense Refill   Fexofenadine-Pseudoephedrine (ALLEGRA-D PO) Take by mouth as needed.     Prenatal Vit-Fe Fumarate-FA (MULTIVITAMIN-PRENATAL) 27-0.8 MG TABS tablet Take 1 tablet by mouth daily at 12 noon.     ibuprofen  (ADVIL ) 600 MG tablet Take 1 tablet (600 mg total) by mouth every 6 (six) hours. (Patient not taking: Reported on 08/25/2024) 30 tablet 0   valACYclovir  (VALTREX ) 500 MG tablet Take 1 tablet (500 mg total) by mouth 2 (two)  times daily. (Patient taking differently: Take 500 mg by mouth 2 (two) times daily. PRN episodic flare ups) 30 tablet 0   No current facility-administered medications on file prior to visit.  [2] No Known Allergies

## 2024-09-16 ENCOUNTER — Other Ambulatory Visit (HOSPITAL_COMMUNITY)
Admission: RE | Admit: 2024-09-16 | Discharge: 2024-09-16 | Disposition: A | Discharge status: Home / Self Care | Source: Ambulatory Visit | Attending: Licensed Practical Nurse | Admitting: Licensed Practical Nurse

## 2024-09-16 ENCOUNTER — Ambulatory Visit: Admitting: Licensed Practical Nurse

## 2024-09-16 ENCOUNTER — Encounter: Payer: Self-pay | Admitting: Licensed Practical Nurse

## 2024-09-16 VITALS — BP 115/85 | HR 106 | Wt 140.7 lb

## 2024-09-16 DIAGNOSIS — Z3481 Encounter for supervision of other normal pregnancy, first trimester: Secondary | ICD-10-CM | POA: Insufficient documentation

## 2024-09-16 DIAGNOSIS — Z3A12 12 weeks gestation of pregnancy: Secondary | ICD-10-CM | POA: Diagnosis not present

## 2024-09-16 DIAGNOSIS — Z113 Encounter for screening for infections with a predominantly sexual mode of transmission: Secondary | ICD-10-CM | POA: Diagnosis present

## 2024-09-16 DIAGNOSIS — Z1379 Encounter for other screening for genetic and chromosomal anomalies: Secondary | ICD-10-CM

## 2024-09-16 DIAGNOSIS — Z0283 Encounter for blood-alcohol and blood-drug test: Secondary | ICD-10-CM

## 2024-09-16 DIAGNOSIS — Z369 Encounter for antenatal screening, unspecified: Secondary | ICD-10-CM

## 2024-09-17 LAB — CERVICOVAGINAL ANCILLARY ONLY
Bacterial Vaginitis (gardnerella): NEGATIVE
Candida Glabrata: NEGATIVE
Candida Vaginitis: NEGATIVE
Chlamydia: NEGATIVE
Comment: NEGATIVE
Comment: NEGATIVE
Comment: NEGATIVE
Comment: NEGATIVE
Comment: NEGATIVE
Comment: NORMAL
Neisseria Gonorrhea: NEGATIVE
Trichomonas: NEGATIVE

## 2024-09-17 LAB — URINALYSIS, ROUTINE W REFLEX MICROSCOPIC
Bilirubin, UA: NEGATIVE
Glucose, UA: NEGATIVE
Ketones, UA: NEGATIVE
Leukocytes,UA: NEGATIVE
Nitrite, UA: NEGATIVE
Protein,UA: NEGATIVE
RBC, UA: NEGATIVE
Urobilinogen, Ur: 0.2 mg/dL (ref 0.2–1.0)
pH, UA: 7.5 (ref 5.0–7.5)

## 2024-09-18 LAB — MONITOR DRUG PROFILE 14(MW)
Amphetamine Scrn, Ur: NEGATIVE ng/mL
BARBITURATE SCREEN URINE: NEGATIVE ng/mL
BENZODIAZEPINE SCREEN, URINE: NEGATIVE ng/mL
Buprenorphine, Urine: NEGATIVE ng/mL
CANNABINOIDS UR QL SCN: NEGATIVE ng/mL
Cocaine (Metab) Scrn, Ur: NEGATIVE ng/mL
Creatinine(Crt), U: 8.6 mg/dL — ABNORMAL LOW (ref 20.0–300.0)
Fentanyl, Urine: NEGATIVE pg/mL
Meperidine Screen, Urine: NEGATIVE ng/mL
Methadone Screen, Urine: NEGATIVE ng/mL
OXYCODONE+OXYMORPHONE UR QL SCN: NEGATIVE ng/mL
Opiate Scrn, Ur: NEGATIVE ng/mL
Ph of Urine: 6.7 (ref 4.5–8.9)
Phencyclidine Qn, Ur: NEGATIVE ng/mL
Propoxyphene Scrn, Ur: NEGATIVE ng/mL
SPECIFIC GRAVITY: 1.0022
Tramadol Screen, Urine: NEGATIVE ng/mL

## 2024-09-18 LAB — NICOTINE SCREEN, URINE: Cotinine Ql Scrn, Ur: NEGATIVE ng/mL

## 2024-09-20 LAB — MATERNIT 21 PLUS CORE, BLOOD
Fetal Fraction: 17
Result (T21): NEGATIVE
Trisomy 13 (Patau syndrome): NEGATIVE
Trisomy 18 (Edwards syndrome): NEGATIVE
Trisomy 21 (Down syndrome): NEGATIVE

## 2024-09-21 LAB — CBC/D/PLT+RPR+RH+ABO+RUBIGG...
Antibody Screen: NEGATIVE
Basophils Absolute: 0 x10E3/uL (ref 0.0–0.2)
Basos: 0 %
EOS (ABSOLUTE): 0.1 x10E3/uL (ref 0.0–0.4)
Eos: 1 %
HCV Ab: NONREACTIVE
HIV Screen 4th Generation wRfx: NONREACTIVE
Hematocrit: 41.1 % (ref 34.0–46.6)
Hemoglobin: 13.5 g/dL (ref 11.1–15.9)
Hepatitis B Surface Ag: NEGATIVE
Immature Grans (Abs): 0 x10E3/uL (ref 0.0–0.1)
Immature Granulocytes: 0 %
Lymphocytes Absolute: 2 x10E3/uL (ref 0.7–3.1)
Lymphs: 20 %
MCH: 29.8 pg (ref 26.6–33.0)
MCHC: 32.8 g/dL (ref 31.5–35.7)
MCV: 91 fL (ref 79–97)
Monocytes Absolute: 0.7 x10E3/uL (ref 0.1–0.9)
Monocytes: 7 %
Neutrophils Absolute: 7.1 x10E3/uL — ABNORMAL HIGH (ref 1.4–7.0)
Neutrophils: 72 %
Platelets: 193 x10E3/uL (ref 150–450)
RBC: 4.53 x10E6/uL (ref 3.77–5.28)
RDW: 11.9 % (ref 11.7–15.4)
RPR Ser Ql: NONREACTIVE
Rh Factor: POSITIVE
Rubella Antibodies, IGG: 2.51 {index}
Varicella zoster IgG: REACTIVE
WBC: 10 x10E3/uL (ref 3.4–10.8)

## 2024-09-21 LAB — HGB FRACTIONATION CASCADE
Hgb A2: 2.7 % (ref 1.8–3.2)
Hgb A: 97.3 % (ref 96.4–98.8)
Hgb F: 0 % (ref 0.0–2.0)
Hgb S: 0 %

## 2024-09-21 LAB — HCV INTERPRETATION

## 2024-09-21 LAB — HEMOGLOBIN A1C
Est. average glucose Bld gHb Est-mCnc: 94 mg/dL
Hgb A1c MFr Bld: 4.9 % (ref 4.8–5.6)

## 2024-09-21 LAB — CULTURE, OB URINE

## 2024-09-21 LAB — URINE CULTURE, OB REFLEX

## 2024-10-14 NOTE — Progress Notes (Unsigned)
" ° ° °  Return Prenatal Note   Subjective   34 y.o. [id number] at [redacted]w[redacted]d presents for this follow-up prenatal visit.  Patient  Patient reports: Having allergy symptoms, ran out of Allegra D, now having Migraines and pain in her sinuses/teeth will get more Allegra today  Has been on Allegra since January for sinus sxs, plus elderberry and nasal lavage, has been without Allegra for 1 week, not having any drainage down her throat or nasal discharge,  just pain over sinuses and teeth, Allegra was makng it better  -Headaches linger, occur almost daily, at her temples and behind her eye hard to described type of pain it makes me irritable   Movement: Present Contractions: Not present  Objective   Flow sheet Vitals: Fetal Heart Rate (bpm): 152 Total weight gain: 10 lb 12.8 oz (4.899 kg)  General Appearance  No acute distress, well appearing, and well nourished Pulmonary   Normal work of breathing Neurologic   Alert and oriented to person, place, and time Psychiatric   Mood and affect within normal limits SSE cervix pink no lesions, physiologic discharge present   Assessment/Plan   Plan  35 y.o. [id number] at [redacted]w[redacted]d presents for follow-up OB visit. Reviewed prenatal record including previous visit note.  Supervision of other normal pregnancy, antepartum -ok to use Allegra, may try Flonase, please call or see PCP if sxs worsen -Rec Magnesium, B2 and CO Q10 for headache, also reviewed ways to decrease stress.  -Pap collected today (pt has colpo in 2023 CIN1, she did not have any follow up since then ) -Anatomy US  scheduled 2/25  -Declined AFP       No orders of the defined types were placed in this encounter.  No follow-ups on file.   Future Appointments  Date Time Provider Department Center  11/05/2024 11:15 AM [facility] US  1 AOB-[facility]G None  06/08/2025 10:00 AM Zafirov, Parris LABOR, MD [facility] [address]    For next visit:  continue with routine prenatal care     JINNIE HERO [facility], CNM   10/15/2609:01 AM  "

## 2024-10-15 ENCOUNTER — Ambulatory Visit: Admitting: Licensed Practical Nurse

## 2024-10-15 ENCOUNTER — Encounter: Payer: Self-pay | Admitting: Licensed Practical Nurse

## 2024-10-15 VITALS — Wt 146.8 lb

## 2024-10-15 DIAGNOSIS — Z3480 Encounter for supervision of other normal pregnancy, unspecified trimester: Secondary | ICD-10-CM

## 2024-10-15 DIAGNOSIS — Z124 Encounter for screening for malignant neoplasm of cervix: Secondary | ICD-10-CM

## 2024-10-15 DIAGNOSIS — Z3A16 16 weeks gestation of pregnancy: Secondary | ICD-10-CM

## 2024-10-15 DIAGNOSIS — Z3482 Encounter for supervision of other normal pregnancy, second trimester: Secondary | ICD-10-CM

## 2024-10-15 NOTE — Patient Instructions (Addendum)
 Magnesium 500 mg daily B2 400 mg daily Coenzyme q10 150 mg daily

## 2024-10-15 NOTE — Assessment & Plan Note (Addendum)
-  ok to use Allegra, may try Flonase, please call or see PCP if sxs worsen -Rec Magnesium, B2 and CO Q10 for headache, also reviewed ways to decrease stress.  -Pap collected today (pt has colpo in 2023 CIN1, she did not have any follow up since then ) -Anatomy US  scheduled 2/25  -Declined AFP

## 2024-10-17 LAB — CYTOLOGY - PAP
Comment: NEGATIVE
Diagnosis: NEGATIVE
Diagnosis: REACTIVE
High risk HPV: NEGATIVE

## 2024-11-05 ENCOUNTER — Other Ambulatory Visit

## 2024-11-12 ENCOUNTER — Encounter: Admitting: Certified Nurse Midwife

## 2025-06-08 ENCOUNTER — Encounter
